# Patient Record
Sex: Female | Born: 1985 | Race: White | Hispanic: No | Smoking: Current every day smoker
Health system: Southern US, Community
[De-identification: ages and names within clinical notes are randomized; demographics above are authoritative.]

## PROBLEM LIST (undated history)

## (undated) DIAGNOSIS — M1990 Unspecified osteoarthritis, unspecified site: Secondary | ICD-10-CM

## (undated) DIAGNOSIS — M4030 Flatback syndrome, site unspecified: Secondary | ICD-10-CM

## (undated) DIAGNOSIS — M4800 Spinal stenosis, site unspecified: Secondary | ICD-10-CM

## (undated) HISTORY — PX: BACK SURGERY: SHX140

## (undated) HISTORY — PX: JOINT REPLACEMENT: SHX530

## (undated) HISTORY — PX: CHOLECYSTECTOMY: SHX55

---

## 2016-01-06 ENCOUNTER — Encounter: Payer: Self-pay | Admitting: *Deleted

## 2016-01-06 ENCOUNTER — Ambulatory Visit: Payer: Medicare Other

## 2016-01-06 ENCOUNTER — Ambulatory Visit
Admission: EM | Admit: 2016-01-06 | Discharge: 2016-01-06 | Disposition: A | Discharge status: Home / Self Care | Payer: Medicare Other | Attending: Family Medicine | Admitting: Family Medicine

## 2016-01-06 DIAGNOSIS — F17210 Nicotine dependence, cigarettes, uncomplicated: Secondary | ICD-10-CM | POA: Diagnosis not present

## 2016-01-06 DIAGNOSIS — R0602 Shortness of breath: Secondary | ICD-10-CM | POA: Diagnosis present

## 2016-01-06 DIAGNOSIS — J189 Pneumonia, unspecified organism: Secondary | ICD-10-CM | POA: Diagnosis not present

## 2016-01-06 DIAGNOSIS — R05 Cough: Secondary | ICD-10-CM | POA: Diagnosis present

## 2016-01-06 DIAGNOSIS — J181 Lobar pneumonia, unspecified organism: Secondary | ICD-10-CM

## 2016-01-06 HISTORY — DX: Spinal stenosis, site unspecified: M48.00

## 2016-01-06 HISTORY — DX: Unspecified osteoarthritis, unspecified site: M19.90

## 2016-01-06 HISTORY — DX: Flatback syndrome, site unspecified: M40.30

## 2016-01-06 MED ORDER — AZITHROMYCIN 250 MG PO TABS: ORAL_TABLET | ORAL | Status: AC

## 2016-01-06 NOTE — Discharge Instructions (Signed)

## 2016-01-06 NOTE — ED Notes (Signed)
Productive cough- clear, dyspnea x2 weeks. Seen [facility] 1 week ago and dx with bronchitis.

## 2016-01-06 NOTE — ED Provider Notes (Signed)
CSN: [id number]     Arrival date & time 01/06/16  1534 History   First MD Initiated Contact with Patient 01/06/16 1659     Chief Complaint  Patient presents with  . Cough  . Shortness of Breath   (Consider location/radiation/quality/duration/timing/severity/associated sxs/prior Treatment) HPI   This is a 30 year old female presents with a productive cough of clear sputum of 2 weeks duration. She was seen at [facility] urgent care about 1 week ago diagnosed with bronchitis placed on 5 days of prednisone given cough medicine and albuterol inhaler. She states that despite the treatment she has continued to cough. She has not had any fever or chills. He continues to smoke up to one pack of cigarettes daily. His numerous comorbidities is currently under a pain contract for spinal stenosis. 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      Past Medical History  Diagnosis Date  . Spinal stenosis   . Flat back syndrome   . Arthritis    Past Surgical History  Procedure Laterality Date  . Back surgery    . Joint replacement    . Cholecystectomy     History reviewed. No pertinent family history. Social History  Substance Use Topics  . Smoking status: Current Every Day Smoker -- 1.00 packs/day    Types: Cigarettes  . Smokeless tobacco: None  . Alcohol Use: No   OB History    No data available     Review of Systems  Constitutional: Negative for fever, chills, activity change and fatigue.  HENT: Positive for congestion, postnasal drip and rhinorrhea.   Respiratory: Positive for cough, shortness of breath and wheezing. Negative for stridor.   All other systems reviewed and are negative.   Allergies  Review of patient's allergies indicates no known allergies.  Home Medications   Prior to Admission medications   Medication Sig Start Date End Date Taking? Authorizing Provider  DULoxetine (CYMBALTA) 20 MG capsule Take 20 mg by  mouth daily.   Yes Historical Provider, MD  fentaNYL (DURAGESIC - DOSED MCG/HR) 100 MCG/HR Place 100 mcg onto the skin every 3 (three) days.   Yes Historical Provider, MD  oxyCODONE-acetaminophen (PERCOCET) 7.5-500 MG tablet Take 1 tablet by mouth every 4 (four) hours as needed for pain.   Yes Historical Provider, MD  azithromycin (ZITHROMAX Z-PAK) 250 MG tablet Use as per package instructions 01/06/16   Lutricia Feil, PA-C   Meds Ordered and Administered this Visit  Medications - No data to display  BP 127/74 mmHg  Pulse 95  Temp(Src) 98.4 F (36.9 C) (Oral)  Resp 16  Ht  (1.651 m)  Wt 230 lb (104.327 kg)  BMI 38.27 kg/m2  SpO2 99% No data found.   Physical Exam  Constitutional: She is oriented to person, place, and time. She appears well-developed and well-nourished. No distress.  HENT:  Head: Normocephalic and atraumatic.  Right Ear: External ear normal.  Left Ear: External ear normal.  Nose: Nose normal.  Mouth/Throat: Oropharynx is clear and moist.  Eyes: Conjunctivae are normal. Pupils are equal, round, and reactive to light.  Neck: Normal range of motion. Neck supple.  Pulmonary/Chest: Effort normal. No respiratory distress. She has wheezes. She has no rales.  Musculoskeletal: Normal range of motion.  Neurological: She is alert and oriented to person, place, and time.  Skin: Skin is warm and dry. She is not diaphoretic.  Psychiatric: She has a normal mood  and affect. Her behavior is normal. Judgment and thought content normal.  Nursing note and vitals reviewed.   ED Course  Procedures (including critical care time)  Labs Review Labs Reviewed - No data to display  Imaging Review Dg Chest 2 View  01/06/2016  CLINICAL DATA:  Productive cough and dyspnea. EXAM: CHEST  2 VIEW COMPARISON:  None. FINDINGS: Cardiomediastinal silhouette is normal. Mediastinal contours appear intact. There is no evidence of, pleural effusion or pneumothorax. Subtle peribronchovascular  opacities in the left lower lobe may represent atelectasis versus developing airspace consolidation. Osseous structures are without acute abnormality. Thoraco lumbar spine fusion is partially visualized. Soft tissues are grossly normal. IMPRESSION: Subtle peribronchovascular opacities in the left lower lobe may represent atelectasis versus developing airspace consolidation. Electronically Signed   By: Ted Mcalpineobrinka  Dimitrova M.D.   On: 01/06/2016 17:31     Visual Acuity Review  Right Eye Distance:   Left Eye Distance:   Bilateral Distance:    Right Eye Near:   Left Eye Near:    Bilateral Near:         MDM   1. Left lower lobe pneumonia    Discharge Medication List as of 01/06/2016  6:02 PM    START taking these medications   Details  azithromycin (ZITHROMAX Z-PAK) 250 MG tablet Use as per package instructions, Normal      Plan: 1. Test/x-ray results and diagnosis reviewed with patient 2. rx as per orders; risks, benefits, potential side effects reviewed with patient 3. Recommend supportive treatment with Cessation of smoking. I've encouraged her to continue using her albuterol inhaler. The radiologist thought that she had either atelectasis or airspace consolidation and her symptoms today as well as her physical exam  shows it more likely to be left lower lobe pneumonia. Will treat her as such with request that she follow up with her primary care physician Dr. Elpidio GaleaPineo in 4-6 weeks for follow-up x-ray to prove cure. She worsens she should be seen at an emergency room. 4. F/u prn if symptoms worsen or don't improve     Lutricia FeilWilliam P Denitra Donaghey, PA-C 01/06/16 1930

## 2017-11-18 IMAGING — CR DG CHEST 2V
3 series · 3 of 3 positions shown · non-contrast
Comparison: None.

CLINICAL DATA: Productive cough and dyspnea.

EXAM:
CHEST  2 VIEW

[chest pa (1 of 2)]
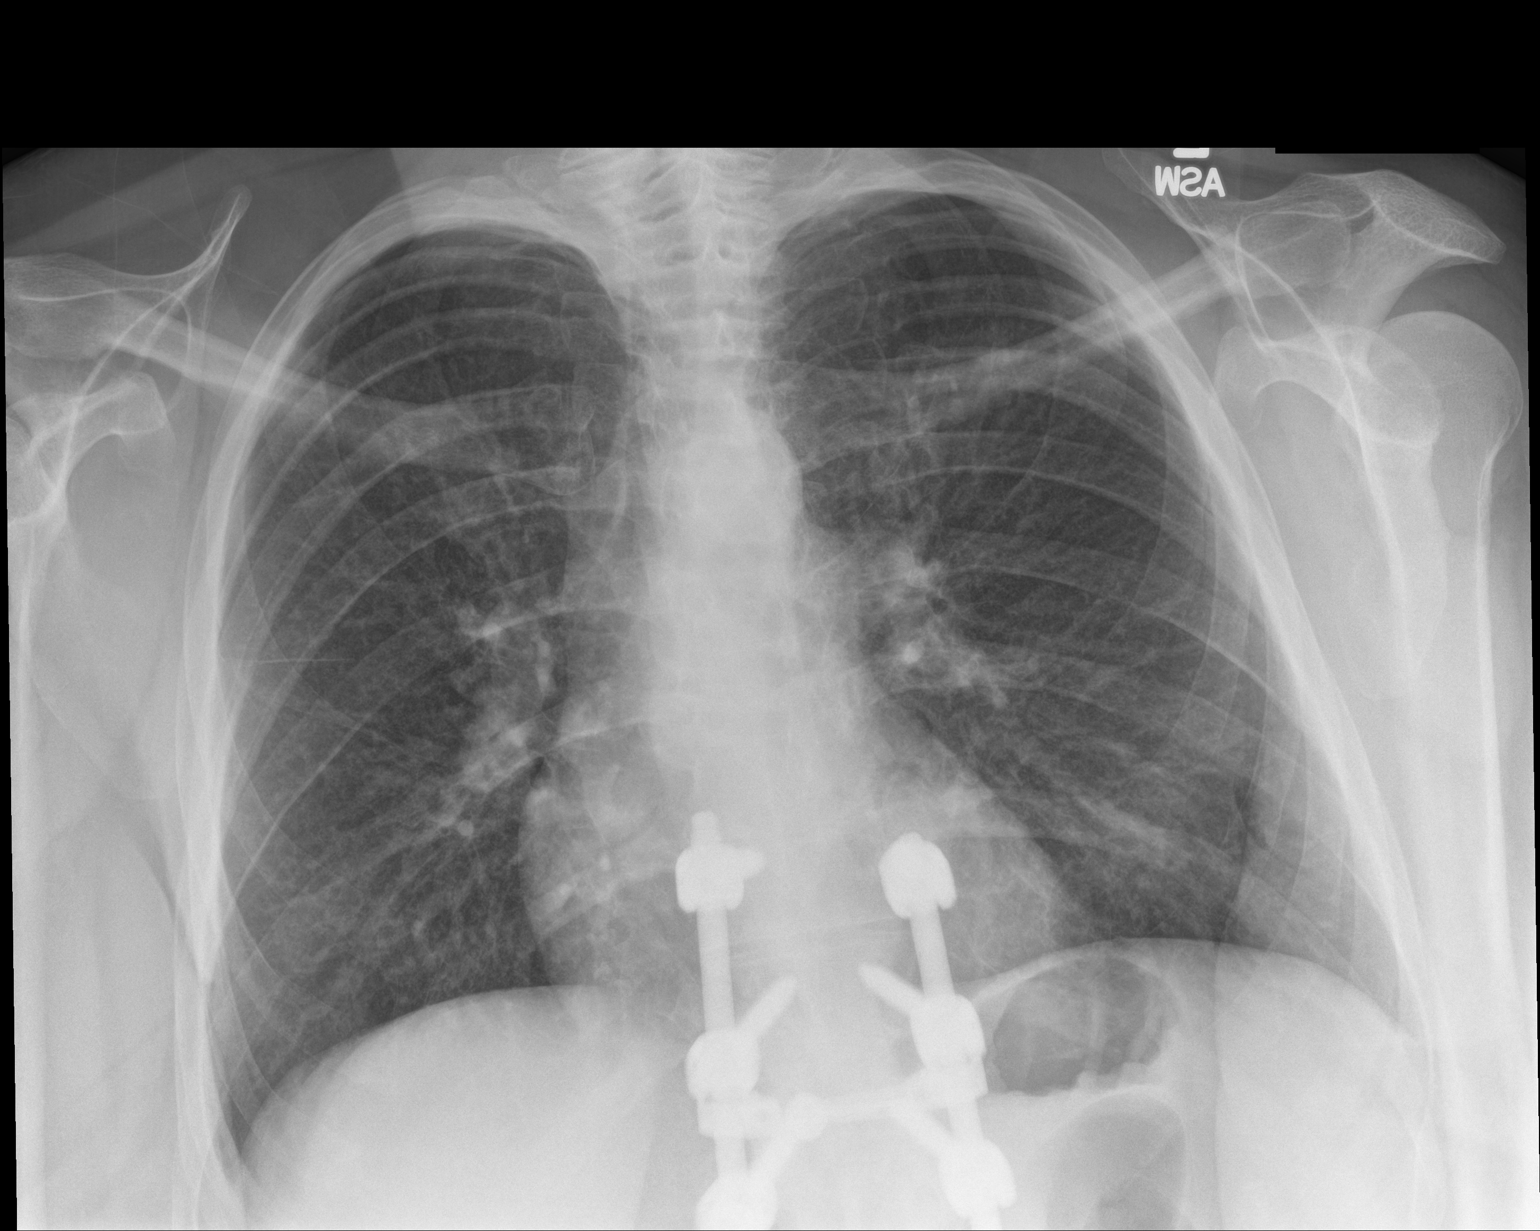

[chest lat]
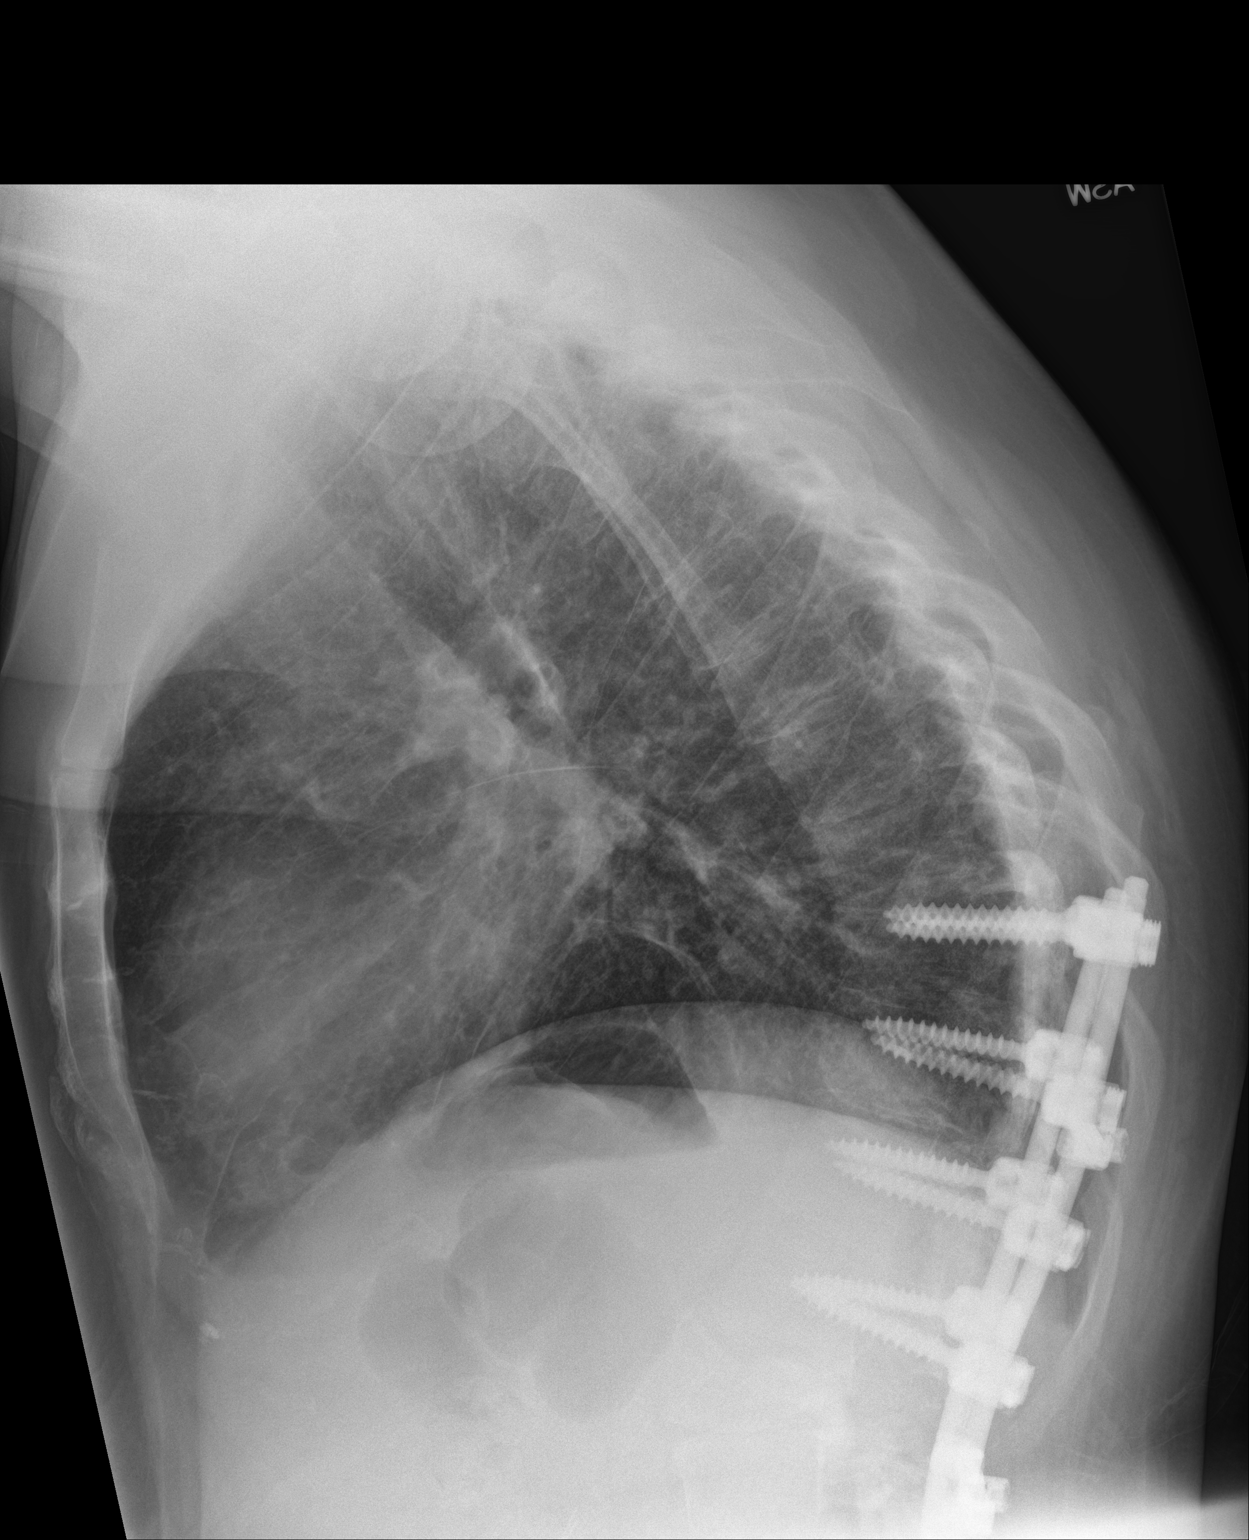

[chest pa (2 of 2)]
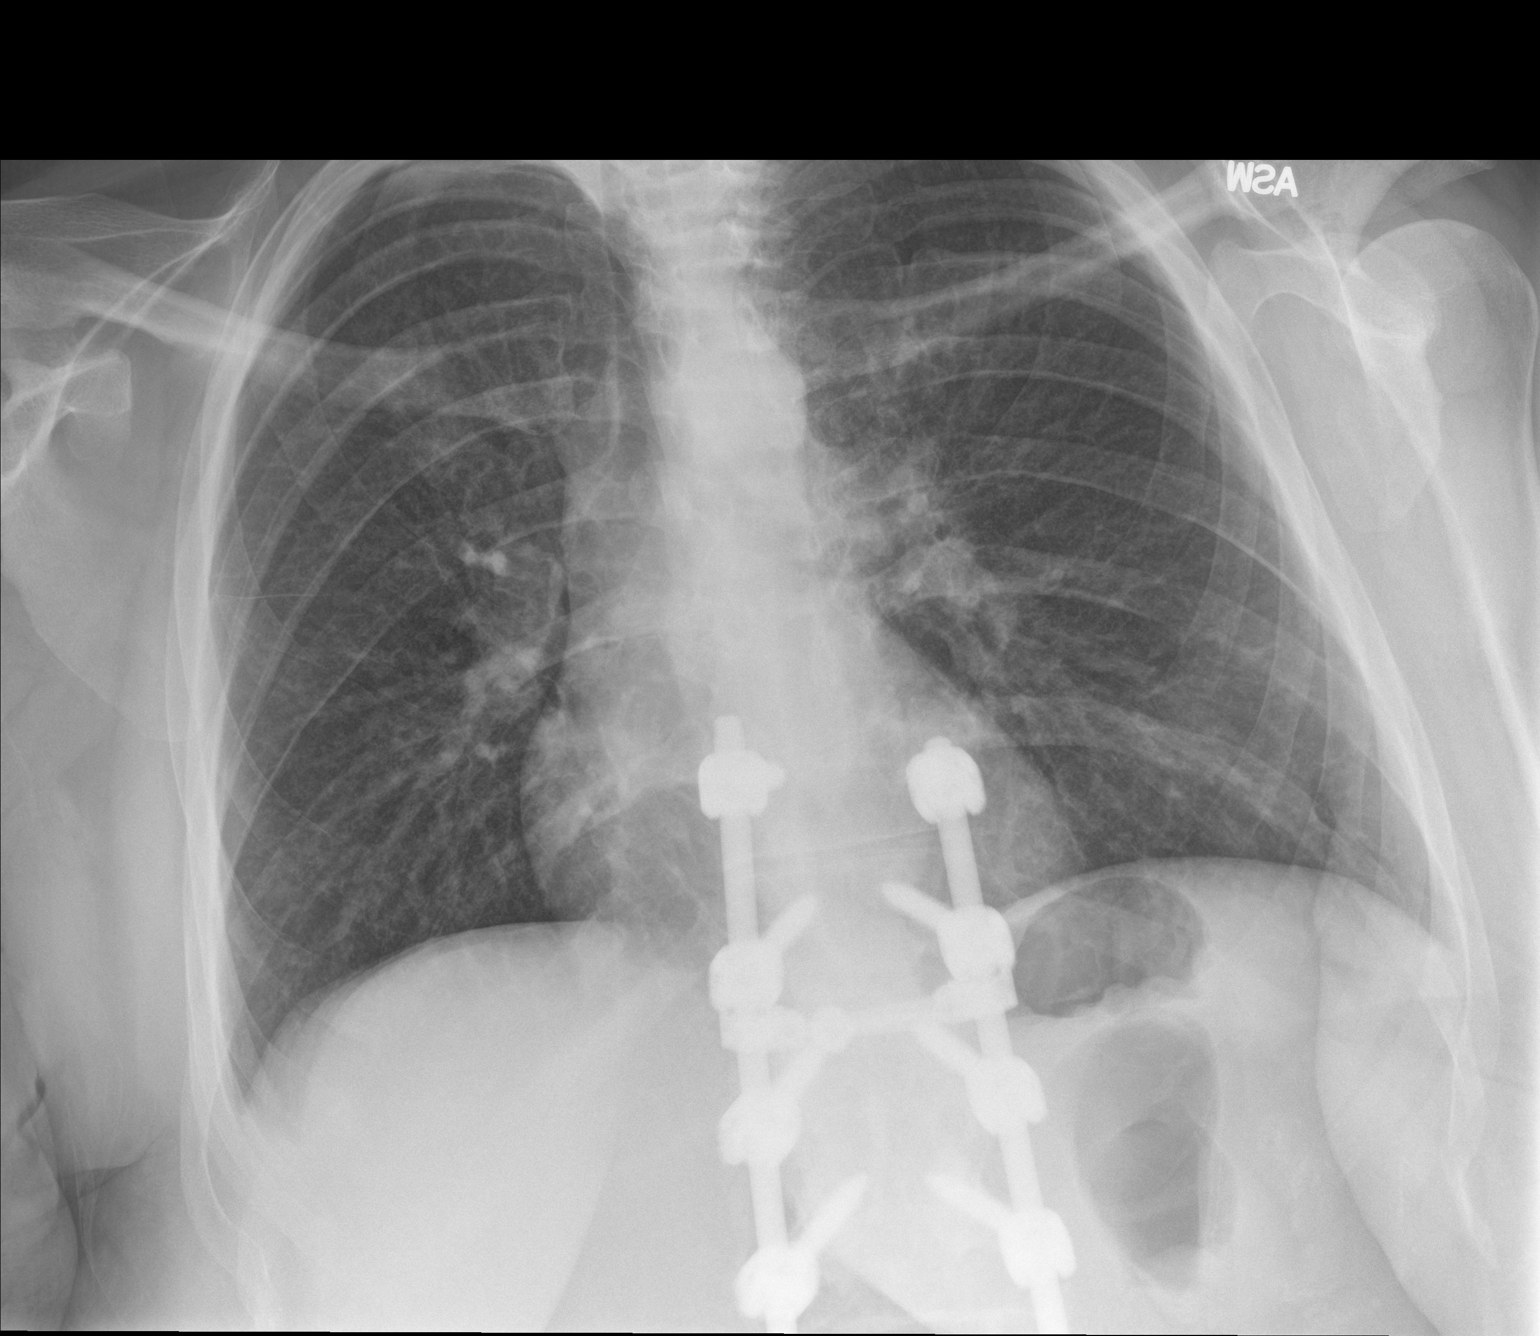

[3 of 3 positions shown; findings below may reference images not displayed]

FINDINGS: Cardiomediastinal silhouette is normal. Mediastinal contours appear
intact.

There is no evidence of, pleural effusion or pneumothorax. Subtle
peribronchovascular opacities in the left lower lobe may represent
atelectasis versus developing airspace consolidation.

Osseous structures are without acute abnormality. Thoraco lumbar
spine fusion is partially visualized. Soft tissues are grossly
normal.
IMPRESSION: Subtle peribronchovascular opacities in the left lower lobe may
represent atelectasis versus developing airspace consolidation.

## 2023-05-12 ENCOUNTER — Encounter (HOSPITAL_BASED_OUTPATIENT_CLINIC_OR_DEPARTMENT_OTHER): Payer: 59 | Attending: Internal Medicine | Admitting: Internal Medicine

## 2023-05-12 DIAGNOSIS — L89312 Pressure ulcer of right buttock, stage 2: Secondary | ICD-10-CM | POA: Diagnosis present

## 2023-05-12 DIAGNOSIS — I872 Venous insufficiency (chronic) (peripheral): Secondary | ICD-10-CM | POA: Diagnosis not present

## 2023-05-12 DIAGNOSIS — I890 Lymphedema, not elsewhere classified: Secondary | ICD-10-CM | POA: Insufficient documentation

## 2023-05-12 DIAGNOSIS — L97118 Non-pressure chronic ulcer of right thigh with other specified severity: Secondary | ICD-10-CM | POA: Diagnosis not present

## 2023-05-12 DIAGNOSIS — I87333 Chronic venous hypertension (idiopathic) with ulcer and inflammation of bilateral lower extremity: Secondary | ICD-10-CM | POA: Insufficient documentation

## 2023-05-12 NOTE — Progress Notes (Signed)
[name], [name] ([id number]) [phone] [id number] Page 1 of 4 Visit Report for 05/12/2023 Abuse Risk Screen Details Patient Name: Date of Service: [name] [name], [name] 05/12/2023 9:30 A M Medical Record Number: [id number] Patient Account Number: [id number] Date of Birth/Sex: Treating RN: [birth year]Carden[birth year] (37 y.o. [name] Primary Care Melvin Whiteford: Other Clinician: Referring [name]: Treating [name]/Extender: Valentino Saxon in Treatment: 0 Abuse Risk Screen Items Answer ABUSE RISK SCREEN: Has anyone close to you tried to hurt or harm you recentlyo No Do you feel uncomfortable with anyone in your familyo No Has anyone forced you do things that you didnt want to doo No Electronic Signature(s) Signed: 05/12/2023 4:30:01 PM By: Shawn Stall RN, BSN Entered By: Shawn Stall on 05/12/2023 06:48:51 -------------------------------------------------------------------------------- Activities of Daily Living Details Patient Name: Date of Service: [name], [name] 05/12/2023 9:30 A M Medical Record Number: [id number] Patient Account Number: [id number] Date of Birth/Sex: Treating RN: [birth year] (37 y.o. [name] Primary Care Champ Keetch: Other Clinician: Referring Eastyn Skalla: Treating [name]/Extender: Valentino Saxon in Treatment: 0 Activities of Daily Living Items Answer Activities of Daily Living (Please select one for each item) Drive Automobile Not Able T Medications ake Completely Able Use T elephone Completely Able Care for Appearance Completely Able Use T oilet Completely Able Bath / Shower Completely Able Dress Self Completely Able Feed Self Completely Able Walk Need Assistance Get In / Out Bed Completely Able Housework Not Able Prepare Meals Not Able Handle Money Not Able Shop for Self Not Able Electronic Signature(s) Signed: 05/12/2023 4:30:01 PM By: Shawn Stall RN, BSN Entered By: Shawn Stall on 05/12/2023  06:49:14 [name] ([id number]) [id number] Page 2 of 4 -------------------------------------------------------------------------------- Education Screening Details Patient Name: Date of Service: [name] [name] 05/12/2023 9:30 A M Medical Record Number: [id number] Patient Account Number: [id number] Date of Birth/Sex: Treating RN: [name] (37 y.o. [name] Primary Care Pinchas Reither: Other Clinician: Referring Abdoulaye Drum: Treating [name]/Extender: Valentino Saxon in Treatment: 0 Primary Learner Assessed: Patient Learning Preferences/Education Level/Primary Language Learning Preference: Explanation, Demonstration, Printed Material Highest Education Level: High School Preferred Language: English Cognitive Barrier Language Barrier: No Translator Needed: No Memory Deficit: No Emotional Barrier: No Cultural/Religious Beliefs Affecting Medical Care: No Physical Barrier Impaired Vision: No Impaired Hearing: No Decreased Hand dexterity: No Knowledge/Comprehension Knowledge Level: Medium Comprehension Level: Medium Ability to understand written instructions: Medium Ability to understand verbal instructions: Medium Motivation Anxiety Level: Calm Cooperation: Cooperative Education Importance: Acknowledges Need Interest in Health Problems: Asks Questions Perception: Coherent Willingness to Engage in Self-Management Medium Activities: Readiness to Engage in Self-Management Medium Activities: Electronic Signature(s) Signed: 05/12/2023 4:30:01 PM By: Shawn Stall RN, BSN Entered By: Shawn Stall on 05/12/2023 06:49:36 -------------------------------------------------------------------------------- Fall Risk Assessment Details Patient Name: Date of Service: [name], [name] 05/12/2023 9:30 A M Medical Record Number: [id number] Patient Account Number: [id number] Date of Birth/Sex: Treating RN: [name] (36 y.o. [name] Primary Care [name]: Other Clinician: Referring Nashaun Hillmer: Treating [name]/Extender: Valentino Saxon in Treatment: 0 Fall Risk Assessment Items Have you had 2 or more falls in the last 12 monthso 0 No [place]ford, [name] ([id number]) Lucie Leather [id number] Page 3 of 4 Have you had any fall that resulted in injury in the last 12 monthso 0 No FALLS RISK SCREEN History of falling - immediate or within 3 months 0 No Secondary diagnosis (Do you have 2 or more medical diagnoseso) 0 No Ambulatory aid None/bed rest/wheelchair/nurse 0 Yes Crutches/cane/walker 15 Yes Furniture 0 No Intravenous therapy Access/Saline/Heparin Lock 0 No Gait/Transferring Normal/ bed rest/  wheelchair 0 No Weak (short steps with or without shuffle, stooped but able to lift head while walking, may seek 10 Yes support from furniture) Impaired (short steps with shuffle, may have difficulty arising from chair, head down, impaired 0 No balance) Mental Status Oriented to own ability 0 Yes Electronic Signature(s) Signed: 05/12/2023 4:30:01 PM By: Shawn Stall RN, BSN Entered By: Shawn Stall on 05/12/2023 06:49:46 -------------------------------------------------------------------------------- Foot Assessment Details Patient Name: Date of Service: [name], [name] 05/12/2023 9:30 A M Medical Record Number: [id number] Patient Account Number: [id number] Date of Birth/Sex: Treating RN: [name] (37 y.o. [name] Primary Care [name]: Other Clinician: Referring [name]: Treating [name]/Extender: Valentino Saxon in Treatment: 0 Foot Assessment Items Site Locations + = Sensation present, - = Sensation absent, C = Callus, U = Ulcer R = Redness, W = Warmth, M = Maceration, PU = Pre-ulcerative lesion F = Fissure, S = Swelling, D = Dryness Assessment Right: Left: Other Deformity: No No Prior Foot Ulcer: No No Prior Amputation: No No Charcot Joint: No  No Ambulatory Status: Ambulatory With Help Assistance Device: [name], [name] ([id number]) [id number] Page 4 of 4 Gait: Unsteady Notes uses wheelchair mostly. Electronic Signature(s) Signed: 05/12/2023 4:30:01 PM By: Shawn Stall RN, BSN Entered By: Shawn Stall on 05/12/2023 06:53:45 -------------------------------------------------------------------------------- Nutrition Risk Screening Details Patient Name: Date of Service: [name], [name] 05/12/2023 9:30 A M Medical Record Number: [id number] Patient Account Number: [id number] Date of Birth/Sex: Treating RN: [birth year]arden[birth year] (37 y.o. Debara Pickett, Millard.Loa Primary Care Saina Waage: Other Clinician: Referring Abdulaziz Toman: Treating Gen Clagg/Extender: Valentino Saxon in Treatment: 0 Height (in): 66 Weight (lbs): 287 Body Mass Index (BMI): 46.3 Nutrition Risk Screening Items Score Screening NUTRITION RISK SCREEN: I have an illness or condition that made me change the kind and/or amount of food I eat 2 Yes I eat fewer than two meals per day 0 No I eat few fruits and vegetables, or milk products 0 No I have three or more drinks of beer, liquor or wine almost every day 0 No I have tooth or mouth problems that make it hard for me to eat 0 No I don't always have enough money to buy the food I need 0 No I eat alone most of the time 0 No I take three or more different prescribed or over-the-counter drugs a day 1 Yes Without wanting to, I have lost or gained 10 pounds in the last six months 0 No I am not always physically able to shop, cook and/or feed myself 0 No Nutrition Protocols Good Risk Protocol Moderate Risk Protocol 0 Provide education on nutrition High Risk Proctocol Risk Level: Moderate Risk Score: 3 Electronic Signature(s) Signed: 05/12/2023 4:30:01 PM By: Shawn Stall RN, BSN Entered By: Shawn Stall on 05/12/2023 06:49:53

## 2023-05-12 NOTE — Progress Notes (Signed)
ALERIA, MAHEU ([id number]) [id number] Page 1 of 11 Visit Report for 05/12/2023 Chief Complaint Document Details Patient Name: Date of Service: [name], [name] 05/12/2023 9:30 A M Medical Record Number: [id number] Patient Account Number: [id number] Date of Birth/Sex: Treating RN: February 17, 1986 (37 y.o. F) Primary Care Provider: Other Clinician: Referring Provider: Treating Provider/Extender: [name] in Treatment: 0 Information Obtained from: Patient Chief Complaint 05/12/2023; patient is here for review of buttock wounds, lower extremity wounds left greater than right. Electronic Signature(s) Signed: 05/12/2023 4:27:46 PM By: Baltazar Najjar MD Entered By: Baltazar Najjar on 05/12/2023 12:49:25 -------------------------------------------------------------------------------- HPI Details Patient Name: Date of Service: [name], [name] 05/12/2023 9:30 A M Medical Record Number: [id number] Patient Account Number: [id number] Date of Birth/Sex: Treating RN: 04/29/1986 (37 y.o. F) Primary Care Provider: Other Clinician: Referring Provider: Treating Provider/Extender: [name] in Treatment: 0 History of Present Illness HPI Description: ADMISSION 05/12/2023 This is a 37 year old woman who currently is a resident of [facility] nursing facility in [place]. She was followed for a long period of time this year at the wound care center in [place] at [facility] for pressure injury on the right buttock, necrotizing infection left greater than right lower extremity wounds. Prior to this she was at [facility] wound care center in [place] and prior to that she spent. Going to the [facility] wound care center I think when she lived in [facility] [place]. She has a complicated past medical history dating back to 2006 I think in the setting of chronic substance abuse she had a left total hip replacement however these hip replacements  became repetitively infected ultimately she had to have a Girdlestone. 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 She seems quite disabled. She has developed bilateral lower extremity wounds in the setting of poorly controlled edema. I am not sure of the history here At some point she became dissatisfied with the wound care center at [facility] and she arrives here today. She has a large pressure ulcer on the posterior left calf. Wounds on the left medial thigh, left posterior thigh and what looks to be developing wounds with secondary infection in the left gluteal fold. She has a chronic wound in the right buttock that has depth but it does not go to bone. It would appear that they were using Dakin's wet to dry and a lot of these wounds I am not really clear what they have been putting on the wounds currently. The patient states that she had topical Keystone antibiotic supplied when she was going to [place] but the facility would not renew it when she ran out of it. She had an MRI of the pelvis on 11/03/2022. This showed the right gluteal ulcer with a sinus tract of the right iliac tubercle it was felt this represented treated osteomyelitis without acute osteomyelitis. She has soft tissue thickening to the left hemisacrum without enhancement no sinus tract. She has a left Girdlestone procedure with enhancement and soft tissue thickening along the postoperative bed. She had right hip marrow edema reactive joint effusion Past medical history includes substance abuse, history of a left total hip replacement in 2006 ultimately requiring a left hip Girdlestone for repetitive infection. She has right hip osteomyelitis, history of multidrug-resistant Pseudomonas. Pressure ulcer of the left leg, chronic pelvic osteomyelitis, chronic left buttock wounds MRSA in 2003. Her  legs are swollen and painful. We did  not feel she could get through [employer]) [name], [name] ([id number]) [id number] Page 2 of 11 Signed: 05/12/2023 4:27:46 PM By: Baltazar Najjar MD Entered By: Baltazar Najjar on 05/12/2023 13:00:19 -------------------------------------------------------------------------------- Physical Exam Details Patient Name: Date of Service: [name], [name] 05/12/2023 9:30 A M Medical Record Number: [id number] Patient Account Number: [id number] Date of Birth/Sex: Treating RN: Jan 21, 1986 (37 y.o. F) Primary Care Provider: Other Clinician: Referring Provider: Treating Provider/Extender: [name] in Treatment: 0 Constitutional Sitting or standing Blood Pressure is within target range for patient.. Pulse regular and within target range for patient.Marland Kitchen Respirations regular, non-labored and within target range.. Temperature is normal and within the target range for the patient.Marland Kitchen Appears in no distress. Respiratory . Clear air entry. Cardiovascular Soft systolic murmur no gallops. Gastrointestinal (GI) Liver and spleen are not palpable. Notes Wound exam Right upper pelvis area there is a small wound with probing depth but no evidence of palpable bone no purulence no crepitus She has extensive wounds starting in the left gluteal fold with surrounding macerated skin question fungal infection, an area in the left posterior thigh and left lateral thigh. - extensive wound on the posterior left calf presumably chronic venous insufficiency. Smaller areas on the right Electronic Signature(s) Signed: 05/12/2023 4:27:46 PM By: Baltazar Najjar MD Entered By: Baltazar Najjar on 05/12/2023 13:04:36 -------------------------------------------------------------------------------- Physician Orders Details Patient Name: Date of Service: [name], [name] 05/12/2023 9:30 A M Medical Record Number:  [id number] Patient Account Number: [id number] Date of Birth/Sex: Treating RN: [birth year] (37 y.o. [name] Primary Care Provider: Other Clinician: Referring Provider: Treating Provider/Extender: [name] in Treatment: 0 The following information was scribed by: Shawn Stall The information was scribed for: Baltazar Najjar Verbal / Phone Orders: No Diagnosis Coding Follow-up Appointments Other: - Skilled Facility to schedule patient an appointment with [facility] wound center. Patient to return to [facility]- current patient of theirs. Anesthetic (In clinic) Topical Lidocaine 4% applied to wound bed Bathing/ Shower/ Hygiene May shower and wash wound with soap and water. [name], [name] ([id number]) [id number] Page 3 of 11 Edema Control - Lymphedema / SCD / Other Elevate legs to the level of the heart or above for 30 minutes daily and/or when sitting for 3-4 times a day throughout the day. Avoid standing for long periods of time. Off-Loading Low air-loss mattress (Group 2) Turn and reposition every 2 hours Other: - minimize sitting in chair for only meals x1 hour each meal. Back to bed turn every 2 hours. Wound Treatment Wound #1 - Lower Leg Wound Laterality: Left, Posterior Cleanser: Soap and Water 3 x Per Week/30 Days Discharge Instructions: May shower and wash wound with dial antibacterial soap and water prior to dressing change. Cleanser: Vashe 5.8 (oz) 3 x Per Week/30 Days Discharge Instructions: Cleanse the wound with Vashe prior to applying a clean dressing using gauze sponges, not tissue or cotton balls. Peri-Wound Care: Triamcinolone 15 (g) 3 x Per Week/30 Days Discharge Instructions: Use triamcinolone 15 (g) as directed Peri-Wound Care: Sween Lotion (Moisturizing lotion) 3 x Per Week/30 Days Discharge Instructions: Apply moisturizing lotion as directed Prim Dressing: Maxorb Extra Ag+ Alginate Dressing, 4x4.75  (in/in) 3 x Per Week/30 Days ary Discharge Instructions: Apply to wound bed as instructed Secondary Dressing: ABD Pad, 8x10 3 x Per Week/30 Days Discharge Instructions: Apply over primary dressing as directed. Compression Wrap: Urgo K2 Lite, (equivalent to a 3 layer) two layer compression system, regular 3 x Per Week/30 Days Discharge Instructions: Apply Urgo K2 Lite  as directed (alternative to 3 layer compression). Wound #2 - Lower Leg Wound Laterality: Right, Lateral, Posterior Cleanser: Soap and Water 3 x Per Week/30 Days Discharge Instructions: May shower and wash wound with dial antibacterial soap and water prior to dressing change. Cleanser: Vashe 5.8 (oz) 3 x Per Week/30 Days Discharge Instructions: Cleanse the wound with Vashe prior to applying a clean dressing using gauze sponges, not tissue or cotton balls. Peri-Wound Care: Triamcinolone 15 (g) 3 x Per Week/30 Days Discharge Instructions: Use triamcinolone 15 (g) as directed Peri-Wound Care: Sween Lotion (Moisturizing lotion) 3 x Per Week/30 Days Discharge Instructions: Apply moisturizing lotion as directed Prim Dressing: Maxorb Extra Ag+ Alginate Dressing, 4x4.75 (in/in) 3 x Per Week/30 Days ary Discharge Instructions: Apply to wound bed as instructed Secondary Dressing: ABD Pad, 8x10 3 x Per Week/30 Days Discharge Instructions: Apply over primary dressing as directed. Compression Wrap: Urgo K2 Lite, (equivalent to a 3 layer) two layer compression system, regular 3 x Per Week/30 Days Discharge Instructions: Apply Urgo K2 Lite as directed (alternative to 3 layer compression). Wound #3 - Lower Leg Wound Laterality: Right, Posterior Cleanser: Soap and Water 3 x Per Week/30 Days Discharge Instructions: May shower and wash wound with dial antibacterial soap and water prior to dressing change. Cleanser: Vashe 5.8 (oz) 3 x Per Week/30 Days Discharge Instructions: Cleanse the wound with Vashe prior to applying a clean dressing using  gauze sponges, not tissue or cotton balls. Peri-Wound Care: Triamcinolone 15 (g) 3 x Per Week/30 Days Discharge Instructions: Use triamcinolone 15 (g) as directed Peri-Wound Care: Sween Lotion (Moisturizing lotion) 3 x Per Week/30 Days Discharge Instructions: Apply moisturizing lotion as directed Prim Dressing: Maxorb Extra Ag+ Alginate Dressing, 4x4.75 (in/in) 3 x Per Week/30 Days ary Discharge Instructions: Apply to wound bed as instructed Secondary Dressing: ABD Pad, 8x10 3 x Per Week/30 Days Discharge Instructions: Apply over primary dressing as directed. Compression Wrap: Urgo K2 Lite, (equivalent to a 3 layer) two layer compression system, regular 3 x Per Week/30 Days Discharge Instructions: Apply Urgo K2 Lite as directed (alternative to 3 layer compression). [name], [name] ([id number]) [id number] Page 4 of 11 Wound #4 - Upper Leg Wound Laterality: Left, Posterior Cleanser: Soap and Water 3 x Per Week/30 Days Discharge Instructions: May shower and wash wound with dial antibacterial soap and water prior to dressing change. Cleanser: Vashe 5.8 (oz) 3 x Per Week/30 Days Discharge Instructions: Cleanse the wound with Vashe prior to applying a clean dressing using gauze sponges, not tissue or cotton balls. Peri-Wound Care: Skin Prep 3 x Per Week/30 Days Discharge Instructions: Use skin prep as directed Prim Dressing: Maxorb Extra Ag+ Alginate Dressing, 4x4.75 (in/in) 3 x Per Week/30 Days ary Discharge Instructions: Apply to wound bed as instructed Secondary Dressing: Zetuvit Plus Silicone Border Dressing 5x5 (in/in) 3 x Per Week/30 Days Discharge Instructions: Apply silicone border over primary dressing as directed. Wound #5 - Gluteal fold Wound Laterality: Left Cleanser: Soap and Water 1 x Per Day/30 Days Discharge Instructions: May shower and wash wound with dial antibacterial soap and water prior to dressing change. Peri-Wound Care: Nystop Powder  (Nystatin) 1 x Per Day/30 Days Discharge Instructions: Apply Nystop (Nystatin) Powder between skin fold and directly to wound. Prim Dressing: Maxorb Extra Ag+ Alginate Dressing, 4x4.75 (in/in) 1 x Per Day/30 Days ary Discharge Instructions: Apply to wound bed as instructed Secondary Dressing: ABD Pad, 8x10 1 x Per Day/30 Days Discharge Instructions: Apply over primary dressing as directed. Wound #6 - Gluteus Wound Laterality:  Right Cleanser: Vashe 5.8 (oz) 1 x Per Day/30 Days Discharge Instructions: Cleanse the wound with Vashe prior to applying a clean dressing using gauze sponges, not tissue or cotton balls. Peri-Wound Care: Skin Prep 1 x Per Day/30 Days Discharge Instructions: Use skin prep as directed Prim Dressing: Maxorb Extra Ag+ Alginate Dressing, 4x4.75 (in/in) 1 x Per Day/30 Days ary Discharge Instructions: Apply to wound bed as instructed Secondary Dressing: Zetuvit Plus Silicone Border Dressing 4x4 (in/in) 1 x Per Day/30 Days Discharge Instructions: Apply silicone border over primary dressing as directed. Patient Medications llergies: No Known Allergies A Notifications Medication Indication Start End applied only in clinic for10/05/2023 lidocaine debridements. DOSE topical 4 % cream - cream topical once daily apply liberally to both 05/12/2023 triamcinolone acetonide legs' periwounds. DOSE 1 - topical 0.1 % cream - 1:4 cetaphil cream topical with dressing changes three times a week. for moisture, redness, 05/12/2023 nystatin and odor. DOSE 1 - topical 100,000 unit/gram powder - 1 powder topical once daily between gluteal folds and between legs. Electronic Signature(s) Signed: 05/12/2023 4:27:46 PM By: Baltazar Najjar MD Signed: 05/12/2023 4:30:01 PM By: Shawn Stall RN, BSN Entered By: Shawn Stall on 05/12/2023 11:13:33 Prescription 05/12/2023 -------------------------------------------------------------------------------- Vern Claude  MD Patient Name: Provider: [name] ([id number]) [id number] Page 5 of 11 05-04-1986 [id number] Date of Birth: NPI#: [id number] Sex: DEA #: [phone] Phone #: License #: [id number] UPN: Patient Address: [address] Eligha Bridegroom [facility] Wound [place], [place] [address] [address] [place], [place] [address] [phone] Allergies No Known Allergies Medication Medication: Route: Strength: Form: triamcinolone acetonide 0.1 % topical cream topical 0.1 % cream Class: TOPICAL ANTI-INFLAMMATORY STEROIDAL Dose: Frequency / Time: Indication: 1 1:4 cetaphil cream topical with dressing changes three times a week. apply liberally to both legs' periwounds. Number of Refills: Number of Units: PRN Three Hundred Fifty Four (354) Generic Substitution: Start Date: End Date: One Time Use: Substitution Permitted 05/12/2023 No Note to Pharmacy: Hand Signature: Date(s): Prescription 05/12/2023 Vern Claude MD Patient Name: Provider: 11-05-1985 [id number] Date of Birth: NPI#: [id number] Sex: DEA #: [phone] Phone #: License #: [id number] UPN: Patient Address: [address] [place] [facility] [place], [place] [address] [address] Floor [place], [place] [address] [phone] Allergies No Known Allergies Medication Medication: Route: Strength: Form: nystatin 100,000 unit/gram topical powder topical 100,000 unit/gram powder Class: TOPICAL ANTIFUNGALS Dose: Frequency / Time: Indication: 1 1 powder topical once daily between gluteal folds and between legs. for moisture, redness, and odor. Number of Refills: Number of Units: PRN Sixty (60) Generic Substitution: Start Date: End Date: One Time Use: Substitution Permitted 05/12/2023 No Note to Pharmacy: Hand Signature: Date(s): Electronic Signature(s) Signed: 05/12/2023  4:27:46 PM By: Baltazar Najjar MD Signed: 05/12/2023 4:30:01 PM By: Shawn Stall RN, BSN Entered By: Shawn Stall on 05/12/2023 11:13:34 [name] ([id number]) [id number] Page 6 of 11 -------------------------------------------------------------------------------- Problem List Details Patient Name: Date of Service: [name], [name] 05/12/2023 9:30 A M Medical Record Number: [id number] Patient Account Number: [id number] Date of Birth/Sex: Treating RN: 31-May-1986 (37 y.o. F) Primary Care Provider: Other Clinician: Referring Provider: Treating Provider/Extender: [name] in Treatment: 0 Active Problems ICD-10 Encounter Code Description Active Date MDM Diagnosis M86.48 Chronic osteomyelitis with draining sinus, other site 05/12/2023 No Yes S31.829D Unspecified open wound of left buttock, subsequent encounter 05/12/2023 No Yes L89.312 Pressure ulcer of right buttock, stage 2 05/12/2023 No Yes  L97.118 Non-pressure chronic ulcer of right thigh with other specified severity 05/12/2023 No Yes I87.333 Chronic venous hypertension (idiopathic) with ulcer and inflammation of 05/12/2023 No Yes bilateral lower extremity Inactive Problems Resolved Problems Electronic Signature(s) Signed: 05/12/2023 4:27:46 PM By: Baltazar Najjar MD Entered By: Baltazar Najjar on 05/12/2023 11:14:05 -------------------------------------------------------------------------------- Progress Note Details Patient Name: Date of Service: [name], [name] 05/12/2023 9:30 A M Medical Record Number: [id number] Patient Account Number: [id number] Date of Birth/Sex: Treating RN: November 30, 1985 (37 y.o. F) Primary Care Provider: Other Clinician: Referring Provider: Treating Provider/Extender: [name] in Treatment: 0 Subjective Chief Complaint Information obtained from Patient 05/12/2023; patient is here for review of buttock wounds, lower extremity  wounds left greater than right. History of Present Illness (HPI) ADMISSION 05/12/2023 This is a 37 year old woman who currently is a resident of [facility] nursing facility in [place]. She was followed for a long period of time this year at the wound care center in [place] at [facility] for pressure injury on the right buttock, necrotizing infection left greater than right lower extremity wounds. Prior to this she was at [facility] wound care center in [place] and prior to that she spent. Going to the [facility] wound care center I think when she ANGLA, DELAHUNT ([id number]) [id number] Page 7 of 11 lived in [place]. She has a complicated past medical history dating back to 2006 I think in the setting of chronic substance abuse she had a left total hip replacement however these hip replacements became repetitively infected ultimately she had to have a Girdlestone. 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 She seems quite disabled. She has developed bilateral lower extremity wounds in the setting of poorly controlled edema. I am not sure of the history here At some point she became dissatisfied with the wound care center at [facility] and she arrives here today. She has a large pressure ulcer on the posterior left calf. Wounds on the left medial thigh, left posterior thigh and what looks to be developing wounds with secondary infection in the left gluteal fold. She has a chronic wound in the right buttock that has depth but it does not go to bone. It would appear that they were using Dakin's wet to dry and a lot of these wounds I am not really clear what they have been putting on the wounds currently. The patient states that she had topical Keystone antibiotic supplied when she was going to [place] but the  facility would not renew it when she ran out of it. She had an MRI of the pelvis on 11/03/2022. This showed the right gluteal ulcer with a sinus tract of the right iliac tubercle it was felt this represented treated osteomyelitis without acute osteomyelitis. She has soft tissue thickening to the left hemisacrum without enhancement no sinus tract. She has a left Girdlestone procedure with enhancement and soft tissue thickening along the postoperative bed. She had right hip marrow edema reactive joint effusion Past medical history includes substance abuse, history of a left total hip replacement in 2006 ultimately requiring a left hip Girdlestone for repetitive infection. She has right hip osteomyelitis, history of multidrug-resistant Pseudomonas. Pressure ulcer of the left leg, chronic pelvic osteomyelitis, chronic left buttock wounds MRSA in 2003. Her legs are swollen and painful. We did not feel she could get through [facility] Patient History Information obtained from Chart. Allergies No Known Allergies  Family History Unknown History. Social History Current every day smoker - 1-3 Cigarettes a day, Alcohol Use - Never, Drug Use - No History, Caffeine Use - Never. Medical History Musculoskeletal Patient has history of Osteomyelitis Hospitalization/Surgery History - T Left Hip Replacement (Girdlestone) 2006. - left fourth toe amputation 2015. otal Medical A Surgical History Notes nd Constitutional Symptoms (General Health) PTSD obesity Integumentary (Skin) cellulitis Musculoskeletal Left Hip replacement 2006 restless leg syndrome Psychiatric General Anxiety and Depression Review of Systems (ROS) Integumentary (Skin) Complains or has symptoms of Wounds - Right buttock wound. Bilateral leg wounds. General Notes: ZD:[id number] abuse Objective Constitutional Sitting or standing Blood Pressure is within target range for patient.. Pulse regular and within target range for patient.Marland Kitchen  Respirations regular, non-labored and within target range.. Temperature is normal and within the target range for the patient.Marland Kitchen Appears in no distress. Vitals Time Taken: 9:40 AM, Height: 66 in, Source: Stated, Weight: 287 lbs, Source: Stated, BMI: 46.3, Temperature: 98.5 F, Pulse: 81 bpm, Respiratory Rate: 16 breaths/min, Blood Pressure: 136/80 mmHg. Respiratory Clear air entry. Cardiovascular Soft systolic murmur no gallops. Gastrointestinal (GI) Liver and spleen are not palpable. General Notes: Wound exam  Right upper pelvis area there is a small wound with probing depth but no evidence of palpable bone no purulence no crepitus  She has extensive wounds starting in the left gluteal fold with surrounding macerated skin question fungal infection, an area in the left posterior thigh and left lateral thigh. - extensive wound on the posterior left calf presumably chronic venous insufficiency. Smaller areas on the right Integumentary (Hair, Skin) Wound #1 status is Open. Original cause of wound was Gradually Appeared. The date acquired was: 08/02/2021. The wound is located on the Left,Posterior [address] [place], [place] ([id number]) [id number] Page 8 of 11 Leg. The wound measures 19cm length x 14cm width x 0.3cm depth; 208.916cm^2 area and 62.675cm^3 volume. There is Fat Layer (Subcutaneous Tissue) exposed. There is a large amount of serosanguineous drainage noted. The wound margin is distinct with the outline attached to the wound base. There is medium (34-66%) red, pink granulation within the wound bed. There is a medium (34-66%) amount of necrotic tissue within the wound bed including Adherent Slough. The periwound skin appearance exhibited: Hemosiderin Staining, Erythema. The periwound skin appearance did not exhibit: Callus, Crepitus, Excoriation, Induration, Rash, Scarring, Dry/Scaly, Maceration, Atrophie Blanche, Cyanosis, Ecchymosis, Mottled, Pallor, Rubor. The  surrounding wound skin color is noted with erythema which is circumferential. Periwound temperature was noted as Hot. The periwound has tenderness on palpation. Wound #2 status is Open. Original cause of wound was Gradually Appeared. The date acquired was: 05/12/2023. The wound is located on the Right,Lateral,Posterior Lower Leg. The wound measures 2cm length x 0.6cm width x 0.1cm depth; 0.942cm^2 area and 0.094cm^3 volume. There is Fat Layer (Subcutaneous Tissue) exposed. There is no tunneling or undermining noted. There is a medium amount of serosanguineous drainage noted. The wound margin is distinct with the outline attached to the wound base. There is small (1-33%) pink granulation within the wound bed. There is a large (67-100%) amount of necrotic tissue within the wound bed including Adherent Slough. The periwound skin appearance exhibited: Dry/Scaly, Hemosiderin Staining. The periwound skin appearance did not exhibit: Callus, Crepitus, Excoriation, Induration, Rash, Scarring, Maceration, Atrophie Blanche, Cyanosis, Ecchymosis, Mottled, Pallor, Rubor, Erythema. Periwound temperature was noted as No Abnormality. Wound #3 status is Open. Original cause of wound was Gradually Appeared. The date acquired was: 10/01/2022. The wound is located on the Right,Posterior Lower Leg.  The wound measures 6cm length x 1.2cm width x 0.1cm depth; 5.655cm^2 area and 0.565cm^3 volume. There is Fat Layer (Subcutaneous Tissue) exposed. There is no tunneling or undermining noted. There is a medium amount of serosanguineous drainage noted. The wound margin is distinct with the outline attached to the wound base. There is small (1-33%) red granulation within the wound bed. There is a large (67-100%) amount of necrotic tissue within the wound bed including Adherent Slough. The periwound skin appearance exhibited: Dry/Scaly, Hemosiderin Staining. The periwound skin appearance did not exhibit: Callus, Crepitus, Excoriation,  Induration, Rash, Scarring, Maceration, Atrophie Blanche, Cyanosis, Ecchymosis, Mottled, Pallor, Rubor, Erythema. Periwound temperature was noted as No Abnormality. Wound #4 status is Open. Original cause of wound was Shear/Friction. The date acquired was: 05/10/2023. The wound is located on the Left,Posterior Upper Leg. The wound measures 3.5cm length x 2.3cm width x 0.1cm depth; 6.322cm^2 area and 0.632cm^3 volume. There is Fat Layer (Subcutaneous Tissue) exposed. There is no tunneling or undermining noted. There is a medium amount of serosanguineous drainage noted. The wound margin is distinct with the outline attached to the wound base. There is large (67-100%) red, pink granulation within the wound bed. There is a small (1-33%) amount of necrotic tissue within the wound bed including Adherent Slough. The periwound skin appearance did not exhibit: Callus, Crepitus, Excoriation, Induration, Rash, Scarring, Dry/Scaly, Maceration, Atrophie Blanche, Cyanosis, Ecchymosis, Hemosiderin Staining, Mottled, Pallor, Rubor, Erythema. Wound #5 status is Open. Original cause of wound was Shear/Friction. The date acquired was: 05/11/2023. The wound is located on the Left Gluteal fold. The wound measures 3cm length x 3.5cm width x 0.1cm depth; 8.247cm^2 area and 0.825cm^3 volume. There is Fat Layer (Subcutaneous Tissue) exposed. There is no tunneling or undermining noted. There is a medium amount of serosanguineous drainage noted. The wound margin is distinct with the outline attached to the wound base. There is small (1-33%) red, pink granulation within the wound bed. There is a large (67-100%) amount of necrotic tissue within the wound bed including Adherent Slough. The periwound skin appearance did not exhibit: Callus, Crepitus, Excoriation, Induration, Rash, Scarring, Dry/Scaly, Maceration, Atrophie Blanche, Cyanosis, Ecchymosis, Hemosiderin Staining, Mottled, Pallor, Rubor, Erythema. The periwound has tenderness  on palpation. Wound #6 status is Open. Original cause of wound was Pressure Injury. The date acquired was: 08/02/2021. The wound is located on the Right Gluteus. The wound measures 1cm length x 2cm width x 1.1cm depth; 1.571cm^2 area and 1.728cm^3 volume. There is Fat Layer (Subcutaneous Tissue) exposed. There is no tunneling or undermining noted. There is a medium amount of purulent drainage noted. Foul odor after cleansing was noted. The wound margin is distinct with the outline attached to the wound base. There is large (67-100%) red granulation within the wound bed. The periwound skin appearance did not exhibit: Callus, Crepitus, Excoriation, Induration, Rash, Scarring, Dry/Scaly, Maceration, Atrophie Blanche, Cyanosis, Ecchymosis, Hemosiderin Staining, Mottled, Pallor, Rubor, Erythema. 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 There was a Double Layer Compression Therapy Procedure by Shawn Stall, RN. Post procedure Diagnosis Wound #1: Same as Pre-Procedure Wound #2 Pre-procedure diagnosis of Wound #2 is a Venous Leg Ulcer located  on the Right,Lateral,Posterior Lower Leg . There was a Double Layer Compression Therapy Procedure by Shawn Stall, RN. Post procedure Diagnosis Wound #2: Same as Pre-Procedure Wound #3 Pre-procedure diagnosis of Wound #3 is a Venous Leg Ulcer located on the Right,Posterior Lower Leg . There was a Double Layer Compression Therapy Procedure by Shawn Stall, RN. Post procedure Diagnosis Wound #3: Same as  Pre-Procedure Plan [name], [name] ([id number]) [id number] Page 9 of 11 Follow-up Appointments: Other: - Skilled Facility to schedule patient an appointment with [facility] wound center. Patient to return to [facility]- current patient of theirs. Anesthetic: (In clinic) Topical Lidocaine 4% applied to wound bed Bathing/ Shower/ Hygiene: May shower and wash wound with soap and water. Edema Control - Lymphedema / SCD / Other: Elevate legs to the level of the heart or above for 30 minutes daily and/or when sitting for 3-4 times a day throughout the day. Avoid standing for long periods of time. Off-Loading: Low air-loss mattress (Group 2) Turn and reposition every 2 hours Other: - minimize sitting in chair for only meals x1 hour each meal. Back to bed turn every 2 hours. The following medication(s) was prescribed: lidocaine topical 4 % cream cream topical once daily for applied only in clinic for debridements. was prescribed at facility triamcinolone acetonide topical 0.1 % cream 1 1:4 cetaphil cream topical with dressing changes three times a week. for apply liberally to both legs' periwounds. starting 05/12/2023 nystatin topical 100,000 unit/gram powder 1 1 powder topical once daily between gluteal folds and between legs. for for moisture, redness, and odor. starting 05/12/2023 WOUND #1: - Lower Leg Wound Laterality: Left, Posterior Cleanser: Soap and Water 3 x Per Week/30 Days Discharge Instructions: May shower and wash wound with dial antibacterial soap and water prior to dressing change. Cleanser: Vashe 5.8 (oz) 3 x Per Week/30 Days Discharge Instructions: Cleanse the wound with Vashe prior to applying a clean dressing using gauze sponges, not tissue or cotton balls. Peri-Wound Care: Triamcinolone 15 (g) 3 x Per Week/30 Days Discharge Instructions: Use triamcinolone 15 (g) as directed Peri-Wound Care: Sween Lotion (Moisturizing lotion) 3 x Per  Week/30 Days Discharge Instructions: Apply moisturizing lotion as directed Prim Dressing: Maxorb Extra Ag+ Alginate Dressing, 4x4.75 (in/in) 3 x Per Week/30 Days ary Discharge Instructions: Apply to wound bed as instructed Secondary Dressing: ABD Pad, 8x10 3 x Per Week/30 Days Discharge Instructions: Apply over primary dressing as directed. Com pression Wrap: Urgo K2 Lite, (equivalent to a 3 layer) two layer compression system, regular 3 x Per Week/30 Days Discharge Instructions: Apply Urgo K2 Lite as directed (alternative to 3 layer compression). WOUND #2: - Lower Leg Wound Laterality: Right, Lateral, Posterior Cleanser: Soap and Water 3 x Per Week/30 Days Discharge Instructions: May shower and wash wound with dial antibacterial soap and water prior to dressing change. Cleanser: Vashe 5.8 (oz) 3 x Per Week/30 Days Discharge Instructions: Cleanse the wound with Vashe prior to applying a clean dressing using gauze sponges, not tissue or cotton balls. Peri-Wound Care: Triamcinolone 15 (g) 3 x Per Week/30 Days Discharge Instructions: Use triamcinolone 15 (g) as directed Peri-Wound Care: Sween Lotion (Moisturizing lotion) 3 x Per Week/30 Days Discharge Instructions: Apply moisturizing lotion as directed Prim Dressing: Maxorb Extra Ag+ Alginate Dressing, 4x4.75 (in/in) 3 x Per Week/30 Days ary Discharge Instructions: Apply to wound bed as instructed Secondary Dressing: ABD Pad, 8x10 3 x Per Week/30 Days Discharge Instructions: Apply over primary dressing as directed. Com pression Wrap: Urgo K2 Lite, (equivalent to  a 3 layer) two layer compression system, regular 3 x Per Week/30 Days Discharge Instructions: Apply Urgo K2 Lite as directed (alternative to 3 layer compression). WOUND #3: - Lower Leg Wound Laterality: Right, Posterior Cleanser: Soap and Water 3 x Per Week/30 Days Discharge Instructions: May shower and wash wound with dial antibacterial soap and water prior to dressing  change. Cleanser: Vashe 5.8 (oz) 3 x Per Week/30 Days Discharge Instructions: Cleanse the wound with Vashe prior to applying a clean dressing using gauze sponges, not tissue or cotton balls. Peri-Wound Care: Triamcinolone 15 (g) 3 x Per Week/30 Days Discharge Instructions: Use triamcinolone 15 (g) as directed Peri-Wound Care: Sween Lotion (Moisturizing lotion) 3 x Per Week/30 Days Discharge Instructions: Apply moisturizing lotion as directed Prim Dressing: Maxorb Extra Ag+ Alginate Dressing, 4x4.75 (in/in) 3 x Per Week/30 Days ary Discharge Instructions: Apply to wound bed as instructed Secondary Dressing: ABD Pad, 8x10 3 x Per Week/30 Days Discharge Instructions: Apply over primary dressing as directed. Com pression Wrap: Urgo K2 Lite, (equivalent to a 3 layer) two layer compression system, regular 3 x Per Week/30 Days Discharge Instructions: Apply Urgo K2 Lite as directed (alternative to 3 layer compression). WOUND #4: - Upper Leg Wound Laterality: Left, Posterior Cleanser: Soap and Water 3 x Per Week/30 Days Discharge Instructions: May shower and wash wound with dial antibacterial soap and water prior to dressing change. Cleanser: Vashe 5.8 (oz) 3 x Per Week/30 Days Discharge Instructions: Cleanse the wound with Vashe prior to applying a clean dressing using gauze sponges, not tissue or cotton balls. Peri-Wound Care: Skin Prep 3 x Per Week/30 Days Discharge Instructions: Use skin prep as directed Prim Dressing: Maxorb Extra Ag+ Alginate Dressing, 4x4.75 (in/in) 3 x Per Week/30 Days ary Discharge Instructions: Apply to wound bed as instructed Secondary Dressing: Zetuvit Plus Silicone Border Dressing 5x5 (in/in) 3 x Per Week/30 Days Discharge Instructions: Apply silicone border over primary dressing as directed. WOUND #5: - Gluteal fold Wound Laterality: Left Cleanser: Soap and Water 1 x Per Day/30 Days Discharge Instructions: May shower and wash wound with dial antibacterial soap and  water prior to dressing change. Peri-Wound Care: Nystop Powder (Nystatin) 1 x Per Day/30 Days Discharge Instructions: Apply Nystop (Nystatin) Powder between skin fold and directly to wound. Prim Dressing: Maxorb Extra Ag+ Alginate Dressing, 4x4.75 (in/in) 1 x Per Day/30 Days ary Discharge Instructions: Apply to wound bed as instructed Secondary Dressing: ABD Pad, 8x10 1 x Per Day/30 Days Discharge Instructions: Apply over primary dressing as directed. WOUND #6: - Gluteus Wound Laterality: Right Cleanser: Vashe 5.8 (oz) 1 x Per Day/30 Days Discharge Instructions: Cleanse the wound with Vashe prior to applying a clean dressing using gauze sponges, not tissue or cotton balls. Peri-Wound Care: Skin Prep 1 x Per Day/30 Days Discharge Instructions: Use skin prep as directed Prim Dressing: Maxorb Extra Ag+ Alginate Dressing, 4x4.75 (in/in) 1 x Per Day/30 Days BRENDI, [name] ([id number]) [id number] Page 10 of 11 Discharge Instructions: Apply to wound bed as instructed Secondary Dressing: Zetuvit Plus Silicone Border Dressing 4x4 (in/in) 1 x Per Day/30 Days Discharge Instructions: Apply silicone border over primary dressing as directed. 1. The patient will need compression wraps on the lower extremities she had Tubigrip when she came in here. I would use silver alginate ABDs under a 3 layer Urgo K2 light compression to both legs. There is far too much swelling here to consider healing these. 2. Silver alginate, Zetuvit, border foam to the wounds on her posterior thigh. 3 nystatin  powder to the areas in the gluteal fold covering with silver alginate #4 silver alginate packing to the left buttock wound. 5. The complexity this patient is well-known to [facility] and I think she should be followed there. There does not seem to be any good reason to bypass the wound care centers in [place] to bring her here. 6. I think a lot of her wounds are probably palliative.  But the wounds on the legs probably could be healed if she came out of the wheelchair for part of the day. 7. The area on the left calf looks like a chronic venous insufficiency wound will need compression to control the tight swelling Electronic Signature(s) Signed: 05/12/2023 4:27:46 PM By: Baltazar Najjar MD Entered By: Baltazar Najjar on 05/12/2023 13:09:02 -------------------------------------------------------------------------------- HxROS Details Patient Name: Date of Service: [name], [name] 05/12/2023 9:30 A M Medical Record Number: [id number] Patient Account Number: [id number] Date of Birth/Sex: Treating RN: 09/26/1985 (37 y.o. Katrinka Blazing Primary Care Provider: Other Clinician: Referring Provider: Treating Provider/Extender: [name] in Treatment: 0 Information Obtained From Chart Integumentary (Skin) Complaints and Symptoms: Positive for: Wounds - Right buttock wound. Bilateral leg wounds Medical History: Past Medical History Notes: cellulitis Constitutional Symptoms (General Health) Medical History: Past Medical History Notes: PTSD obesity Musculoskeletal Medical History: Positive for: Osteomyelitis Past Medical History Notes: Left Hip replacement 2006 restless leg syndrome Psychiatric Medical History: Past Medical History Notes: General Anxiety and Depression Immunizations Pneumococcal Vaccine: Received Pneumococcal Vaccination: No Implantable Devices No devices added [name], RENGEL ([id number]) [id number] Page 11 of 11 Hospitalization / Surgery History Type of Hospitalization/Surgery T Left Hip Replacement ([facility]) 2006 otal left fourth toe amputation 2015 Family and Social History Unknown History: Yes; Current every day smoker - 1-3 Cigarettes a day; Alcohol Use: Never; Drug Use: No History; Caffeine Use: Never; Financial Concerns: No; Food, Clothing or Shelter Needs: No; Support System  Lacking: No; Transportation Concerns: No Notes NW:[id number] abuse Electronic Signature(s) Signed: 05/12/2023 12:57:41 PM By: Karie Schwalbe RN Signed: 05/12/2023 4:27:46 PM By: Baltazar Najjar MD Signed: 05/12/2023 4:30:01 PM By: Shawn Stall RN, BSN Entered By: Shawn Stall on 05/12/2023 09:53:12 -------------------------------------------------------------------------------- SuperBill Details Patient Name: Date of Service: MA RORI, GOAR 05/12/2023 Medical Record Number: [id number] Patient Account Number: [id number] Date of Birth/Sex: Treating RN: November 30, 1985 (37 y.o. 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

## 2023-05-12 NOTE — Progress Notes (Addendum)
[name], [name] ([id number]) [id number] Page 1 of 18 Visit Report for 05/12/2023 Allergy List Details Patient Name: Date of Service: [name] [name], [name] 05/12/2023 9:30 A M Medical Record Number: [id number] Patient Account Number: [id number] Date of Birth/Sex: Treating RN: [birth year] (37 y.o. [name] Primary Care [name]: Other Clinician: Referring [name]: Treating [name]/Extender: [name] in Treatment: 0 Allergies Active Allergies No Known Allergies Allergy Notes Electronic Signature(s) Signed: 05/12/2023 12:57:41 PM By: Karie Schwalbe RN Entered By: Karie Schwalbe on 05/12/2023 08:48:38 -------------------------------------------------------------------------------- Arrival Information Details Patient Name: Date of Service: [name] 05/12/2023 9:30 A M Medical Record Number: [id number] Patient Account Number: [id number] Date of Birth/Sex: Treating RN: 05-Jul-1986 (37 y.o. [name] Primary Care [name]: Other Clinician: Referring [name]: Treating Precious Segall/Extender: [name] in Treatment: 0 Visit Information Patient Arrived: Wheel Chair Arrival Time: 09:42 Accompanied By: self Transfer Assistance: Manual Patient Identification Verified: Yes Secondary Verification Process Completed: Yes Patient Requires Transmission-Based Precautions: No Patient Has Alerts: No Electronic Signature(s) Signed: 05/12/2023 4:30:01 PM By: Kendra Stall RN, BSN Entered By: [name] on 05/12/2023 09:42:32 -------------------------------------------------------------------------------- Clinic Level of Care Assessment Details Patient Name: Date of Service: Kendra [name], [name] 05/12/2023 9:30 A M Medical Record Number: [id number] Patient Account Number: [id number] [name] ([id number]) [id number] Page 2 of 18 Date of Birth/Sex: Treating RN: [name] (37 y.o. [name] Primary Care Zair Borawski: Other Clinician: Referring [name]: Treating [name]/Extender: [name] in Treatment: 0 Clinic Level of Care Assessment Items TOOL 1 Quantity Score X- 1 0 Use when EandM and Procedure is performed on INITIAL visit ASSESSMENTS - Nursing Assessment / Reassessment X- 1 20 General Physical Exam (combine w/ comprehensive assessment (listed just below) when performed on new pt. evals) X- 1 25 Comprehensive Assessment (HX, ROS, Risk Assessments, Wounds Hx, etc.) ASSESSMENTS - Wound and Skin Assessment / Reassessment X- 1 10 Dermatologic / Skin Assessment (not related to wound area) ASSESSMENTS - Ostomy and/or Continence Assessment and Care []  - 0 Incontinence Assessment and Management []  - 0 Ostomy Care Assessment and Management (repouching, etc.) PROCESS - Coordination of Care []  - 0 Simple Patient / Family Education for ongoing care X- 1 20 Complex (extensive) Patient / Family Education for ongoing care X- 1 10 Staff obtains Chiropractor, Records, T Results / Process Orders est X- 1 10 Staff telephones HHA, Nursing Homes / Clarify orders / etc []  - 0 Routine Transfer to another Facility (non-emergent condition) []  - 0 Routine Hospital Admission (non-emergent condition) X- 1 15 New Admissions / Manufacturing engineer / Ordering NPWT Apligraf, etc. , []  - 0 Emergency Hospital Admission (emergent condition) PROCESS - Special Needs []  - 0 Pediatric / Minor Patient Management []  - 0 Isolation Patient Management []  - 0 Hearing / Language / Visual special needs []  - 0 Assessment of Community assistance (transportation, D/C planning, etc.) []  - 0 Additional assistance / Altered mentation []  - 0 Support Surface(s) Assessment (bed, cushion, seat, etc.) INTERVENTIONS - Miscellaneous []  - 0 External ear exam []  - 0 Patient Transfer (multiple staff / Nurse, adult / Similar devices) []  - 0 Simple Staple / Suture removal (25 or  less) []  - 0 Complex Staple / Suture removal (26 or more) []  - 0 Hypo/Hyperglycemic Management (do not check if billed separately) []  - 0 Ankle / Brachial Index (ABI) - do not check if billed separately Has the patient been seen at the hospital within the last three years: Yes Total Score: 110 Level Of Care: New/Established - Level 3  Electronic Signature(s) Signed: 05/12/2023 4:30:01 PM By: Kendra Stall RN, BSN Entered By: [name] on 05/12/2023 12:23:18 [name] ([id number]) [id number] Page 3 of 18 -------------------------------------------------------------------------------- Compression Therapy Details Patient Name: Date of Service: [name] [name], [name] 05/12/2023 9:30 A M Medical Record Number: [id number] Patient Account Number: [id number] Date of Birth/Sex: Treating RN: [birth year] (37 y.o. [name] Primary Care Jamyla Ard: Other Clinician: Referring [name]: Treating [name]/Extender: [name] in Treatment: 0 Compression Therapy Performed for Wound Assessment: Wound #1 Left,Posterior Lower Leg Performed By: Clinician Kendra Stall, RN Compression Type: Double Layer Post Procedure Diagnosis Same as Pre-procedure Electronic Signature(s) Signed: 05/12/2023 4:30:01 PM By: Kendra Stall RN, BSN Entered By: [name] on 05/12/2023 12:23:33 -------------------------------------------------------------------------------- Compression Therapy Details Patient Name: Date of Service: Kendra [name], [name] 05/12/2023 9:30 A M Medical Record Number: [id number] Patient Account Number: [id number] Date of Birth/Sex: Treating RN: 1986-05-25 (37 y.o. [name] Primary Care [name]: Other Clinician: Referring [name]: Treating [name]/Extender: [name] in Treatment: 0 Compression Therapy Performed for Wound Assessment: Wound #2 Right,Lateral,Posterior Lower Leg Performed By: Clinician Kendra Stall,  RN Compression Type: Double Layer Post Procedure Diagnosis Same as Pre-procedure Electronic Signature(s) Signed: 05/12/2023 4:30:01 PM By: Kendra Stall RN, BSN Entered By: [name] on 05/12/2023 12:23:33 -------------------------------------------------------------------------------- Compression Therapy Details Patient Name: Date of Service: Kendra [name], [name] 05/12/2023 9:30 A M Medical Record Number: [id number] Patient Account Number: [id number] Date of Birth/Sex: Treating RN: [birth year] (37 y.o. [name] Primary Care Jessicaann Overbaugh: Other Clinician: Referring Dajanay Northrup: Treating Shamyra Farias/Extender: [name] in Treatment: 0 Compression Therapy Performed for Wound Assessment: Wound #3 Right,Posterior Lower Leg Performed By: Clinician Kendra Stall, RN Compression Type: Double Layer Post Procedure Diagnosis Same as Pre-procedure Electronic Signature(s) Signed: 05/12/2023 4:30:01 PM By: Kendra Stall RN, BSN Arnold, Cala Kendra ([id number]) By: Kendra Stall RN, BSN [id number].pdf Page 4 of 18 Signed: 05/12/2023 4:30:01 PM Entered By: [name] on 05/12/2023 12:23:33 -------------------------------------------------------------------------------- Encounter Discharge Information Details Patient Name: Date of Service: Kendra [name], [name] 05/12/2023 9:30 A M Medical Record Number: [id number] Patient Account Number: [id number] Date of Birth/Sex: Treating RN: [birth year] (37 y.o. [name] Primary Care Jamy Cleckler: Other Clinician: Referring Louetta Hollingshead: Treating Jourdin Connors/Extender: [name] in Treatment: 0 Encounter Discharge Information Items Discharge Condition: Stable Ambulatory Status: Wheelchair Discharge Destination: Skilled Nursing Facility Telephoned: No Orders Sent: Yes Transportation: Private Auto Accompanied By: self Schedule Follow-up Appointment: Yes Clinical Summary of Care: Electronic  Signature(s) Signed: 05/12/2023 4:30:01 PM By: Kendra Stall RN, BSN Entered By: [name] on 05/12/2023 12:50:44 -------------------------------------------------------------------------------- Lower Extremity Assessment Details Patient Name: Date of Service: Kendra [name], [name] 05/12/2023 9:30 A M Medical Record Number: [id number] Patient Account Number: [id number] Date of Birth/Sex: Treating RN: [birth year] (37 y.o. Debara Pickett, Millard.Loa Primary Care Destine Ambroise: Other Clinician: Referring Luan Maberry: Treating Ainsleigh Kakos/Extender: [name] in Treatment: 0 Edema Assessment Assessed: Kyra Searles: Yes] [Right: Yes] Edema: [Left: Yes] [Right: Yes] Calf Left: Right: Point of Measurement: 36 cm From Medial Instep 42 cm 40 cm Ankle Left: Right: Point of Measurement: 13 cm From Medial Instep 25.5 cm 23 cm Knee To Floor Left: Right: From Medial Instep 46 cm 46 cm Vascular Assessment Pulses: Dorsalis Sheridyn Canino, Linea ([id number]) [Right:[id number] Page 5 of 18] Palpable: [Left:Yes] [Right:Yes] Doppler Audible: [Left:Yes] [Right:Yes] Posterior Tibial Palpable: [Left:Yes] [Right:No] Extremity colors, hair growth, and conditions: Extremity Color: [Left:Hyperpigmented] [Right:Hyperpigmented] Hair Growth on Extremity: [Left:No] [Right:No] Temperature of Extremity: [Left:Hot] [Right:Warm] Capillary Refill: [Left:< 3 seconds] [Right:< 3 seconds] Dependent Rubor: [Left:No] [Right:No] Blanched when Elevated: [Left:No  Yes] [Right:No Yes] Toe Nail Assessment Left: Right: Thick: Yes Yes Discolored: Yes Yes Deformed: Yes Yes Improper Length and Hygiene: Yes Yes Electronic Signature(s) Signed: 05/12/2023 4:30:01 PM By: Kendra Stall RN, BSN Entered By: [name] on 05/12/2023 10:31:50 -------------------------------------------------------------------------------- Multi Wound Chart Details Patient Name: Date of Service: Kendra [name], [name] 05/12/2023  9:30 A M Medical Record Number: [id number] Patient Account Number: [id number] Date of Birth/Sex: Treating RN: [birth year] (37 y.o. 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 Primary Etiology: Lymphedema Lymphedema Lymphedema Secondary Etiology: Osteomyelitis Osteomyelitis Osteomyelitis Comorbid History: 08/02/2021 05/12/2023 10/01/2022 Date Acquired: 0 0 0 Weeks of Treatment: Open Open Open Wound Status: No No No Wound Recurrence: 19x14x0.3 2x0.6x0.1 6x1.2x0.1 Measurements L x W x D (cm) 208.916 0.942 5.655 A (cm) : rea 62.675 0.094 0.565 Volume (cm) : Full Thickness Without Exposed Full Thickness Without Exposed Full Thickness Without Exposed Classification: Support Structures Support Structures Support Structures Large Medium Medium Exudate Amount: Serosanguineous Serosanguineous Serosanguineous Exudate TypeBRYLEA, PITA ([id number]) [id number] Page 6 of 18 red, brown red, brown red, brown Exudate Color: No No No Foul Odor A Cleansing: fter N/A N/A N/A Odor Anticipated Due to Product Use: Distinct, outline attached Distinct, outline attached Distinct, outline attached Wound Margin: Medium (34-66%) Small (1-33%) Small (1-33%) Granulation A mount: Red, Pink Pink Red Granulation Quality: Medium (34-66%) Large (67-100%) Large (67-100%) Necrotic Amount: Fat Layer (Subcutaneous Tissue): Yes Fat Layer (Subcutaneous Tissue): Yes Fat Layer  (Subcutaneous Tissue): Yes Exposed Structures: Fascia: No Fascia: No Fascia: No Tendon: No Tendon: No Tendon: No Muscle: No Muscle: No Muscle: No Joint: No Joint: No Joint: No Bone: No Bone: No Bone: No None N/A None Epithelialization: Excoriation: No Excoriation: No Excoriation: No Periwound Skin Texture: Induration: No Induration: No Induration: No Callus: No Callus: No Callus: No Crepitus: No Crepitus: No Crepitus: No Rash: No Rash: No Rash: No Scarring: No Scarring: No Scarring: No Maceration: No Dry/Scaly: Yes Dry/Scaly: Yes Periwound Skin Moisture: Dry/Scaly: No Maceration: No Maceration: No Erythema: Yes Hemosiderin Staining: Yes Hemosiderin Staining: Yes Periwound Skin Color: Hemosiderin Staining: Yes Atrophie Blanche: No Atrophie Blanche: No Atrophie Blanche: No Cyanosis: No Cyanosis: No Cyanosis: No Ecchymosis: No Ecchymosis: No Ecchymosis: No Erythema: No Erythema: No Mottled: No Mottled: No Mottled: No Pallor: No Pallor: No Pallor: No Rubor: No Rubor: No Rubor: No Circumferential N/A N/A Erythema Location: Hot No Abnormality No Abnormality Temperature: Yes N/A N/A Tenderness on Palpation: Compression Therapy Compression Therapy Compression Therapy Procedures Performed: Wound Number: [id number] [phone]  Photos: Left, Posterior Upper Leg Left Gluteal fold Right Gluteus Wound Location: Shear/Friction Shear/Friction Pressure Injury Wounding Event: Skin Tear Incontinence Associated 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 N/A N/A Secondary Etiology: Osteomyelitis Osteomyelitis Osteomyelitis Comorbid History: 05/10/2023 05/11/2023 08/02/2021 Date Acquired: 0 0 0 Weeks of Treatment: Open Open Open Wound Status: No No No Wound Recurrence: 3.5x2.3x0.1 3x3.5x0.1 1x2x1.1 Measurements L x W x D (cm) 6.322 8.247 1.571 A (cm) : rea 0.632 0.825 1.728 Volume (cm) : Full Thickness Without Exposed Full Thickness Without  Exposed Category/Stage III Classification: Support Structures Support Structures Medium Medium Medium Exudate Amount: Serosanguineous Serosanguineous Purulent Exudate Type: red, brown red,  brown yellow, brown, green Exudate Color: No No Yes Foul Odor A Cleansing: fter N/A N/A No Odor Anticipated Due to Product Use: Distinct, outline attached Distinct, outline attached Distinct, outline attached Wound Margin: Large (67-100%) Small (1-33%) Large (67-100%) Granulation A mount: Red, Pink Red, Pink Red Granulation Quality: Small (1-33%) Large (67-100%) N/A Necrotic Amount: Fat Layer (Subcutaneous Tissue): Yes Fat Layer (Subcutaneous Tissue): Yes Fat Layer (Subcutaneous Tissue): Yes Exposed Structures: Fascia: No Fascia: No Fascia: No Tendon: No Tendon: No Tendon: No Muscle: No Muscle: No Muscle: No Joint: No Joint: No Joint: No Bone: No Bone: No Bone: No Small (1-33%) None None Epithelialization: Excoriation: No Excoriation: No Excoriation: No Periwound Skin Texture: Induration: No Induration: No Induration: No Callus: No Callus: No Callus: No Crepitus: No Crepitus: No Crepitus: No Rash: No Rash: No Rash: No Scarring: No Scarring: No Scarring: No Maceration: No Maceration: No Maceration: No Periwound Skin Moisture: Dry/Scaly: No Dry/Scaly: No Dry/Scaly: No MYKERIA, GARMAN ([id number]) [id number] Page 7 of 18 Atrophie Blanche: No Atrophie Blanche: No Atrophie Blanche: No Periwound Skin Color: Cyanosis: No Cyanosis: No Cyanosis: No Ecchymosis: No Ecchymosis: No Ecchymosis: No Erythema: No Erythema: No Erythema: No Hemosiderin Staining: No Hemosiderin Staining: No Hemosiderin Staining: No Mottled: No Mottled: No Mottled: No Pallor: No Pallor: No Pallor: No Rubor: No Rubor: No Rubor: No N/A N/A N/A Erythema Location: N/A N/A N/A Temperature: N/A Yes N/A Tenderness on Palpation: N/A N/A  N/A Procedures Performed: Treatment Notes Wound #1 (Lower Leg) Wound Laterality: Left, Posterior Cleanser Soap and Water Discharge Instruction: May shower and wash wound with dial antibacterial soap and water prior to dressing change. Vashe 5.8 (oz) Discharge Instruction: Cleanse the wound with Vashe prior to applying a clean dressing using gauze sponges, not tissue or cotton balls. Peri-Wound Care Triamcinolone 15 (g) Discharge Instruction: Use triamcinolone 15 (g) as directed Sween Lotion (Moisturizing lotion) Discharge Instruction: Apply moisturizing lotion as directed Topical Primary Dressing Maxorb Extra Ag+ Alginate Dressing, 4x4.75 (in/in) Discharge Instruction: Apply to wound bed as instructed Secondary Dressing ABD Pad, 8x10 Discharge Instruction: Apply over primary dressing as directed. Secured With Compression Wrap Urgo K2 Lite, (equivalent to a 3 layer) two layer compression system, regular Discharge Instruction: Apply Urgo K2 Lite as directed (alternative to 3 layer compression). Compression Stockings Add-Ons Wound #2 (Lower Leg) Wound Laterality: Right, Lateral, Posterior Cleanser Soap and Water Discharge Instruction: May shower and wash wound with dial antibacterial soap and water prior to dressing change. Vashe 5.8 (oz) Discharge Instruction: Cleanse the wound with Vashe prior to applying a clean dressing using gauze sponges, not tissue or cotton balls. Peri-Wound Care Triamcinolone 15 (g) Discharge Instruction: Use triamcinolone 15 (g) as directed Sween Lotion (Moisturizing lotion) Discharge Instruction: Apply moisturizing lotion as directed Topical Primary Dressing Maxorb Extra Ag+ Alginate Dressing, 4x4.75 (in/in) Discharge Instruction: Apply to wound bed as instructed Secondary Dressing ABD Pad, 8x10 Discharge Instruction: Apply over primary dressing as directed. Secured With [employer], Cala Kendra ([id number])  [id number] Page 8 of 18 Urgo K2 Lite, (equivalent to a 3 layer) two layer compression system, regular Discharge Instruction: Apply Urgo K2 Lite as directed (alternative to 3 layer compression). Compression Stockings Add-Ons Wound #3 (Lower Leg) Wound Laterality: Right, Posterior Cleanser Soap and Water Discharge Instruction: May shower and wash wound with dial antibacterial soap and water prior to dressing change. Vashe 5.8 (oz) Discharge Instruction: Cleanse the wound with Vashe prior to applying a clean dressing using gauze sponges, not tissue or cotton balls. Peri-Wound Care Triamcinolone 15 (  g) Discharge Instruction: Use triamcinolone 15 (g) as directed Sween Lotion (Moisturizing lotion) Discharge Instruction: Apply moisturizing lotion as directed Topical Primary Dressing Maxorb Extra Ag+ Alginate Dressing, 4x4.75 (in/in) Discharge Instruction: Apply to wound bed as instructed Secondary Dressing ABD Pad, 8x10 Discharge Instruction: Apply over primary dressing as directed. Secured With Compression Wrap Urgo K2 Lite, (equivalent to a 3 layer) two layer compression system, regular Discharge Instruction: Apply Urgo K2 Lite as directed (alternative to 3 layer compression). Compression Stockings Add-Ons Wound #4 (Upper Leg) Wound Laterality: Left, Posterior Cleanser Soap and Water Discharge Instruction: May shower and wash wound with dial antibacterial soap and water prior to dressing change. Vashe 5.8 (oz) Discharge Instruction: Cleanse the wound with Vashe prior to applying a clean dressing using gauze sponges, not tissue or cotton balls. Peri-Wound Care Skin Prep Discharge Instruction: Use skin prep as directed Topical Primary Dressing Maxorb Extra Ag+ Alginate Dressing, 4x4.75 (in/in) Discharge Instruction: Apply to wound bed as instructed Secondary Dressing Zetuvit Plus Silicone Border Dressing 5x5 (in/in) Discharge Instruction: Apply silicone  border over primary dressing as directed. Secured With Compression Wrap Compression Stockings Add-Ons Wound #5 (Gluteal fold) Wound Laterality: Left [address] and [address] [place], Cala Kendra ([id number]) [id number] Page 9 of 18 Discharge Instruction: May shower and wash wound with dial antibacterial soap and water prior to dressing change. Peri-Wound Care Nystop Powder (Nystatin) Discharge Instruction: Apply Nystop (Nystatin) Powder between skin fold and directly to wound. Topical Primary Dressing Maxorb Extra Ag+ Alginate Dressing, 4x4.75 (in/in) Discharge Instruction: Apply to wound bed as instructed Secondary Dressing ABD Pad, 8x10 Discharge Instruction: Apply over primary dressing as directed. Secured With Compression Wrap Compression Stockings Add-Ons Wound #6 (Gluteus) Wound Laterality: Right Cleanser Vashe 5.8 (oz) Discharge Instruction: Cleanse the wound with Vashe prior to applying a clean dressing using gauze sponges, not tissue or cotton balls. Peri-Wound Care Skin Prep Discharge Instruction: Use skin prep as directed Topical Primary Dressing Maxorb Extra Ag+ Alginate Dressing, 4x4.75 (in/in) Discharge Instruction: Apply to wound bed as instructed Secondary Dressing Zetuvit Plus Silicone Border Dressing 4x4 (in/in) Discharge Instruction: Apply silicone border over primary dressing as directed. Secured With Compression Wrap Compression Stockings Facilities manager) Signed: 05/12/2023 4:27:46 PM By: Baltazar Najjar MD Entered By: Baltazar Najjar on 05/12/2023 12:48:29 -------------------------------------------------------------------------------- Multi-Disciplinary Care Plan Details Patient Name: Date of Service: Kendra [name], [name] 05/12/2023 9:30 A M Medical Record Number: [id number] Patient Account Number: [id number] Date of Birth/Sex: Treating RN: 15-Sep-1985 (37 y.o. [name] Primary Care Sean Macwilliams: Other  Clinician: Referring Rawan Riendeau: Treating Mercer Stallworth/Extender: [name] in Treatment: [address] Inactive [name] ([id number]) [id number] Page 10 of 18 Electronic Signature(s) Signed: 05/16/2023 4:57:17 PM By: Kendra Stall RN, BSN Previous Signature: 05/12/2023 4:30:01 PM Version By: Kendra Stall RN, BSN Entered By: [name] on 05/16/2023 16:57:16 -------------------------------------------------------------------------------- Pain Assessment Details Patient Name: Date of Service: Kendra [name], [name] 05/12/2023 9:30 A M Medical Record Number: [id number] Patient Account Number: [id number] Date of Birth/Sex: Treating RN: 09/01/1985 (37 y.o. [name] Primary Care Neldon Shepard: Other Clinician: Referring Skye Rodarte: Treating Panagiotis Oelkers/Extender: [name] in Treatment: 0 Active Problems Location of Pain Severity and Description of Pain Patient Has Paino Yes Site Locations Pain Location: Generalized Pain, Pain in Ulcers Rate the pain. Current Pain Level: 6 Pain Management and Medication Current Pain Management: Medication: No Cold Application: No Rest: No Massage: No Activity: No T.E.N.S.: No Heat Application: No Leg drop or elevation: No Is the Current Pain Management Adequate: Adequate How does your wound impact your  activities of daily livingo Sleep: No Bathing: No Appetite: No Relationship With Others: No Bladder Continence: No Emotions: No Bowel Continence: No Work: No Toileting: No Drive: No Dressing: No Hobbies: No Psychologist, prison and probation services) Signed: 05/12/2023 4:30:01 PM By: Kendra Stall RN, BSN Entered By: [name] on 05/12/2023 09:50:10 [name] ([id number]) [id number] Page 11 of 18 -------------------------------------------------------------------------------- Patient/Caregiver Education Details Patient Name: Date of Service: [name] LATISE, DILLEY  10/10/2024andnbsp9:30 A M Medical Record Number: [id number] Patient Account Number: [id number] Date of Birth/Gender: Treating RN: 03-Feb-1986 (37 y.o. [name] Primary Care Physician: Other Clinician: Referring Physician: Treating Physician/Extender: [name] in Treatment: 0 Education Assessment Education Provided To: Patient Education Topics Provided Welcome [facility]New [facility] Packet: o Handouts: Welcome T [facility] o Methods: Explain/Verbal Responses: Reinforcements needed Electronic Signature(s) Signed: 05/12/2023 4:30:01 PM By: Kendra Stall RN, BSN Entered By: [name] on 05/12/2023 09:51:44 -------------------------------------------------------------------------------- Wound Assessment Details Patient Name: Date of Service: Kendra [name], [name] 05/12/2023 9:30 A M Medical Record Number: [id number] Patient Account Number: [id number] Date of Birth/Sex: Treating RN: [birth year] (37 y.o. [name] Primary Care Neveah Bang: Other Clinician: Referring Jamia Hoban: Treating Sarh Kirschenbaum/Extender: [name] in Treatment: 0 Wound Status Wound Number: 1 Primary Etiology: Venous Leg Ulcer Wound Location: Left, Posterior Lower Leg Secondary Etiology: Lymphedema Wounding Event: Gradually Appeared Wound Status: Open Date Acquired: 08/02/2021 Comorbid History: Osteomyelitis Weeks Of Treatment: 0 Clustered Wound: No Photos Wound Measurements Length: (cm) 19 Width: (cm) 14 Depth: (cm) 0.3 Area: (cm) 208.916 Volume: (cm) 62.675 % Reduction in Area: % Reduction in Volume: Epithelialization: None Wound Description TISH, BEGIN ([id number]) Classification: Full Thickness Without Exposed Support Structures Wound Margin: Distinct, outline attached Exudate Amount: Large Exudate Type: Serosanguineous Exudate Color: red, brown [id number] Page 12 of 18 Foul Odor After Cleansing:  No Slough/Fibrino Yes Wound Bed Granulation Amount: Medium (34-66%) Exposed Structure Granulation Quality: Red, Pink Fascia Exposed: No Necrotic Amount: Medium (34-66%) Fat Layer (Subcutaneous Tissue) Exposed: Yes Necrotic Quality: Adherent Slough Tendon Exposed: No Muscle Exposed: No Joint Exposed: No Bone Exposed: No Periwound Skin Texture Texture Color No Abnormalities Noted: No No Abnormalities Noted: No Callus: No Atrophie Blanche: No Crepitus: No Cyanosis: No Excoriation: No Ecchymosis: No Induration: No Erythema: Yes Rash: No Erythema Location: Circumferential Scarring: No Hemosiderin Staining: Yes Mottled: No Moisture Pallor: No No Abnormalities Noted: No Rubor: No Dry / Scaly: No Maceration: No Temperature / Pain Temperature: Hot Tenderness on Palpation: Yes Electronic Signature(s) Signed: 05/12/2023 12:57:41 PM By: Karie Schwalbe RN Signed: 05/12/2023 4:30:01 PM By: Kendra Stall RN, BSN Entered By: Karie Schwalbe on 05/12/2023 09:56:44 -------------------------------------------------------------------------------- Wound Assessment Details Patient Name: Date of Service: Kendra APURVA, REILY 05/12/2023 9:30 A M Medical Record Number: [id number] Patient Account Number: [id number] Date of Birth/Sex: Treating RN: June 09, 1986 (37 y.o. [name] Primary Care Tanieka Pownall: Other Clinician: Referring Tekoa Amon: Treating Myca Perno/Extender: [name] in Treatment: 0 Wound Status Wound Number: 2 Primary Etiology: Venous Leg Ulcer Wound Location: Right, Lateral, Posterior Lower Leg Secondary Etiology: Lymphedema Wounding Event: Gradually Appeared Wound Status: Open Date Acquired: 05/12/2023 Comorbid History: Osteomyelitis Weeks Of Treatment: 0 Clustered Wound: No Photos BAYLEY, YARBOROUGH ([id number]) [id number] Page 13 of 18 Wound Measurements Length: (cm) 2 Width: (cm) 0.6 Depth: (cm) 0.1 Area: (cm)  0.942 Volume: (cm) 0.094 % Reduction in Area: % Reduction in Volume: Tunneling: No Undermining: No Wound Description Classification: Full Thickness Without Exposed Suppor Wound Margin: Distinct, outline attached Exudate Amount: Medium Exudate Type: Serosanguineous Exudate Color: red, brown t  Structures Foul Odor After Cleansing: No Slough/Fibrino Yes Wound Bed Granulation Amount: Small (1-33%) Exposed Structure Granulation Quality: Pink Fascia Exposed: No Necrotic Amount: Large (67-100%) Fat Layer (Subcutaneous Tissue) Exposed: Yes Necrotic Quality: Adherent Slough Tendon Exposed: No Muscle Exposed: No Joint Exposed: No Bone Exposed: No Periwound Skin Texture Texture Color No Abnormalities Noted: No No Abnormalities Noted: No Callus: No Atrophie Blanche: No Crepitus: No Cyanosis: No Excoriation: No Ecchymosis: No Induration: No Erythema: No Rash: No Hemosiderin Staining: Yes Scarring: No Mottled: No Pallor: No Moisture Rubor: No No Abnormalities Noted: No Dry / Scaly: Yes Temperature / Pain Maceration: No Temperature: No Abnormality Electronic Signature(s) Signed: 05/12/2023 12:57:41 PM By: Karie Schwalbe RN Signed: 05/12/2023 4:30:01 PM By: Kendra Stall RN, BSN Entered By: Karie Schwalbe on 05/12/2023 10:01:06 -------------------------------------------------------------------------------- Wound Assessment Details Patient Name: Date of Service: Kendra ISABELL, BONAFEDE 05/12/2023 9:30 A M Medical Record Number: [id number] Patient Account Number: [id number] Date of Birth/Sex: Treating RN: 1986/04/22 (37 y.o. [name] Primary Care [place] Basham: Other Clinician: Referring Tameca Jerez: Treating Wakeelah Solan/Extender: [name] in Treatment: 0 Wound Status Wound Number: 3 Primary Etiology: Venous Leg Ulcer Wound Location: Right, Posterior Lower Leg Secondary Etiology: Lymphedema Wounding Event: Gradually Appeared Wound Status: Open Date  Acquired: 10/01/2022 Comorbid History: Osteomyelitis Weeks Of Treatment: 0 Clustered Wound: No Photos KEAIRA, WHITEHURST ([id number]) [id number] Page 14 of 18 Wound Measurements Length: (cm) 6 Width: (cm) 1.2 Depth: (cm) 0.1 Area: (cm) 5.655 Volume: (cm) 0.565 % Reduction in Area: % Reduction in Volume: Epithelialization: None Tunneling: No Undermining: No Wound Description Classification: Full Thickness Without Exposed Suppor Wound Margin: Distinct, outline attached Exudate Amount: Medium Exudate Type: Serosanguineous Exudate Color: red, brown t Structures Foul Odor After Cleansing: No Slough/Fibrino Yes Wound Bed Granulation Amount: Small (1-33%) Exposed Structure Granulation Quality: Red Fascia Exposed: No Necrotic Amount: Large (67-100%) Fat Layer (Subcutaneous Tissue) Exposed: Yes Necrotic Quality: Adherent Slough Tendon Exposed: No Muscle Exposed: No Joint Exposed: No Bone Exposed: No Periwound Skin Texture Texture Color No Abnormalities Noted: No No Abnormalities Noted: No Callus: No Atrophie Blanche: No Crepitus: No Cyanosis: No Excoriation: No Ecchymosis: No Induration: No Erythema: No Rash: No Hemosiderin Staining: Yes Scarring: No Mottled: No Pallor: No Moisture Rubor: No No Abnormalities Noted: No Dry / Scaly: Yes Temperature / Pain Maceration: No Temperature: No Abnormality Electronic Signature(s) Signed: 05/12/2023 12:57:41 PM By: Karie Schwalbe RN Signed: 05/12/2023 4:30:01 PM By: Kendra Stall RN, BSN Entered By: Karie Schwalbe on 05/12/2023 10:02:27 -------------------------------------------------------------------------------- Wound Assessment Details Patient Name: Date of Service: Kendra BLANCHE, SCOVELL 05/12/2023 9:30 A M Medical Record Number: [id number] Patient Account Number: [id number] Date of Birth/Sex: Treating RN: [birth year] (37 y.o. [name] Primary Care Jatniel Verastegui: Other  Clinician: Referring Damaree Sargent: Treating Teira Arcilla/Extender: [name] in Treatment: 0 Wound Status [name] ([id number]) [id number] Page 15 of 18 Wound Number: 4 Primary Etiology: Skin Tear Wound Location: Left, Posterior Upper Leg Wound Status: Open Wounding Event: Shear/Friction Comorbid History: Osteomyelitis Date Acquired: 05/10/2023 Weeks Of Treatment: 0 Clustered Wound: No Photos Wound Measurements Length: (cm) 3.5 Width: (cm) 2.3 Depth: (cm) 0.1 Area: (cm) 6.322 Volume: (cm) 0.632 % Reduction in Area: % Reduction in Volume: Epithelialization: Small (1-33%) Tunneling: No Undermining: No Wound Description Classification: Full Thickness Without Exposed Suppor Wound Margin: Distinct, outline attached Exudate Amount: Medium Exudate Type: Serosanguineous Exudate Color: red, brown t Structures Foul Odor After Cleansing: No Slough/Fibrino Yes Wound Bed Granulation Amount: Large (67-100%) Exposed Structure Granulation Quality: Red, Pink Fascia Exposed: No Necrotic Amount: Small (1-33%) Fat  Layer (Subcutaneous Tissue) Exposed: Yes Necrotic Quality: Adherent Slough Tendon Exposed: No Muscle Exposed: No Joint Exposed: No Bone Exposed: No Periwound Skin Texture Texture Color No Abnormalities Noted: No No Abnormalities Noted: No Callus: No Atrophie Blanche: No Crepitus: No Cyanosis: No Excoriation: No Ecchymosis: No Induration: No Erythema: No Rash: No Hemosiderin Staining: No Scarring: No Mottled: No Pallor: No Moisture Rubor: No No Abnormalities Noted: No Dry / Scaly: No Maceration: No Electronic Signature(s) Signed: 05/12/2023 12:57:41 PM By: Karie Schwalbe RN Entered By: Karie Schwalbe on 05/12/2023 10:07:00 -------------------------------------------------------------------------------- Wound Assessment Details Patient Name: Date of Service: Kendra LEANOR, VORIS 05/12/2023 9:30 A Ocie Cornfield,  Cala Kendra ([id number]) [id number] Page 16 of 18 Medical Record Number: [id number] Patient Account Number: [id number] Date of Birth/Sex: Treating RN: 01/23/1986 (38 y.o. [name] Primary Care Normon Pettijohn: Other Clinician: Referring Brinly Maietta: Treating Delonda Coley/Extender: [name] in Treatment: 0 Wound Status Wound Number: 5 Primary Etiology: Incontinence Associated Dermatitis (IAD) Wound Location: Left Gluteal fold Wound Status: Open Wounding Event: Shear/Friction Comorbid History: Osteomyelitis Date Acquired: 05/11/2023 Weeks Of Treatment: 0 Clustered Wound: No Photos Wound Measurements Length: (cm) 3 Width: (cm) 3.5 Depth: (cm) 0.1 Area: (cm) 8.247 Volume: (cm) 0.825 % Reduction in Area: % Reduction in Volume: Epithelialization: None Tunneling: No Undermining: No Wound Description Classification: Full Thickness Without Exposed Support Structures Wound Margin: Distinct, outline attached Exudate Amount: Medium Exudate Type: Serosanguineous Exudate Color: red, brown Foul Odor After Cleansing: No Slough/Fibrino Yes Wound Bed Granulation Amount: Small (1-33%) Exposed Structure Granulation Quality: Red, Pink Fascia Exposed: No Necrotic Amount: Large (67-100%) Fat Layer (Subcutaneous Tissue) Exposed: Yes Necrotic Quality: Adherent Slough Tendon Exposed: No Muscle Exposed: No Joint Exposed: No Bone Exposed: No Periwound Skin Texture Texture Color No Abnormalities Noted: No No Abnormalities Noted: No Callus: No Atrophie Blanche: No Crepitus: No Cyanosis: No Excoriation: No Ecchymosis: No Induration: No Erythema: No Rash: No Hemosiderin Staining: No Scarring: No Mottled: No Pallor: No Moisture Rubor: No No Abnormalities Noted: No Dry / Scaly: No Temperature / Pain Maceration: No Tenderness on Palpation: Yes Electronic Signature(s) Signed: 05/12/2023 12:57:41 PM By: Karie Schwalbe RN Entered By: Karie Schwalbe  on 05/12/2023 10:10:11 [name] ([id number]) [id number] Page 17 of 18 -------------------------------------------------------------------------------- Wound Assessment Details Patient Name: Date of Service: Kendra SHAUNESSY, DOBRATZ 05/12/2023 9:30 A M Medical Record Number: [id number] Patient Account Number: [id number] Date of Birth/Sex: Treating RN: [birth year] (37 y.o. [name] Primary Care Rafeef Lau: Other Clinician: Referring Ching Rabideau: Treating Somer Trotter/Extender: [name] in Treatment: 0 Wound Status Wound Number: 6 Primary Etiology: Pressure Ulcer Wound Location: Right Gluteus Wound Status: Open Wounding Event: Pressure Injury Comorbid History: Osteomyelitis Date Acquired: 08/02/2021 Weeks Of Treatment: 0 Clustered Wound: No Photos Wound Measurements Length: (cm) 1 Width: (cm) 2 Depth: (cm) 1 Area: (cm) Volume: (cm) % Reduction in Area: % Reduction in Volume: .1 Epithelialization: None 1.571 Tunneling: No 1.728 Undermining: No Wound Description Classification: Category/Stage III Wound Margin: Distinct, outline attached Exudate Amount: Medium Exudate Type: Purulent Exudate Color: yellow, brown, green Foul Odor After Cleansing: Yes Due to Product Use: No Slough/Fibrino Yes Wound Bed Granulation Amount: Large (67-100%) Exposed Structure Granulation Quality: Red Fascia Exposed: No Fat Layer (Subcutaneous Tissue) Exposed: Yes Tendon Exposed: No Muscle Exposed: No Joint Exposed: No Bone Exposed: No Periwound Skin Texture Texture Color No Abnormalities Noted: No No Abnormalities Noted: No Callus: No Atrophie Blanche: No Crepitus: No Cyanosis: No Excoriation: No Ecchymosis: No Induration: No Erythema: No Rash: No Hemosiderin Staining: No Scarring: No Mottled: No Pallor: No Moisture  Rubor: No No Abnormalities Noted: No Dry / Scaly: No Maceration: No ZAHNIYA, ZELLARS ([id number])  [id number].pdf Page 18 of 18 Electronic Signature(s) Signed: 05/12/2023 12:57:41 PM By: Karie Schwalbe RN Entered By: Karie Schwalbe on 05/12/2023 10:12:48 -------------------------------------------------------------------------------- Vitals Details Patient Name: Date of Service: Kendra ANECIA, NUSBAUM 05/12/2023 9:30 A M Medical Record Number: [id number] Patient Account Number: [id number] Date of Birth/Sex: Treating RN: 19-Sep-1985 (37 y.o. Debara Pickett, Millard.Loa Primary Care Mireille Lacombe: Other Clinician: Referring Shadell Brenn: Treating Nareg Breighner/Extender: [name] in Treatment: 0 Vital Signs Time Taken: 09:40 Temperature (F): 98.5 Height (in): 66 Pulse (bpm): 81 Source: Stated Respiratory Rate (breaths/min): 16 Weight (lbs): 287 Blood Pressure (mmHg): 136/80 Source: Stated Reference Range: 80 - 120 mg / dl Body Mass Index (BMI): 46.3 Electronic Signature(s) Signed: 05/12/2023 4:30:01 PM By: Kendra Stall RN, BSN Entered By: [name] on 05/12/2023 09:45:18
